# Patient Record
Sex: Male | Born: 1980
Health system: Southern US, Community
[De-identification: ages and names within clinical notes are randomized; demographics above are authoritative.]

## PROBLEM LIST (undated history)

## (undated) DIAGNOSIS — L709 Acne, unspecified: Secondary | ICD-10-CM

## (undated) DIAGNOSIS — R569 Unspecified convulsions: Secondary | ICD-10-CM

## (undated) DIAGNOSIS — F17200 Nicotine dependence, unspecified, uncomplicated: Secondary | ICD-10-CM

## (undated) DIAGNOSIS — F419 Anxiety disorder, unspecified: Secondary | ICD-10-CM

## (undated) DIAGNOSIS — E785 Hyperlipidemia, unspecified: Secondary | ICD-10-CM

## (undated) DIAGNOSIS — Z720 Tobacco use: Secondary | ICD-10-CM

## (undated) DIAGNOSIS — R7989 Other specified abnormal findings of blood chemistry: Secondary | ICD-10-CM

## (undated) DIAGNOSIS — K644 Residual hemorrhoidal skin tags: Secondary | ICD-10-CM

## (undated) DIAGNOSIS — IMO0002 Reserved for concepts with insufficient information to code with codable children: Secondary | ICD-10-CM

## (undated) DIAGNOSIS — T7840XA Allergy, unspecified, initial encounter: Secondary | ICD-10-CM

## (undated) DIAGNOSIS — T63441A Toxic effect of venom of bees, accidental (unintentional), initial encounter: Secondary | ICD-10-CM

## (undated) DIAGNOSIS — I10 Essential (primary) hypertension: Secondary | ICD-10-CM

## (undated) DIAGNOSIS — F1011 Alcohol abuse, in remission: Secondary | ICD-10-CM

## (undated) HISTORY — DX: Allergy, unspecified, initial encounter: T78.40XA

## (undated) HISTORY — DX: Tobacco use: Z72.0

## (undated) HISTORY — DX: Unspecified convulsions: R56.9

## (undated) HISTORY — DX: Anxiety disorder, unspecified: F41.9

## (undated) HISTORY — DX: Alcohol abuse, in remission: F10.11

## (undated) HISTORY — DX: Hyperlipidemia, unspecified: E78.5

## (undated) HISTORY — DX: Other specified abnormal findings of blood chemistry: R79.89

## (undated) HISTORY — DX: Acne, unspecified: L70.9

## (undated) HISTORY — DX: Toxic effect of venom of bees, accidental (unintentional), initial encounter: T63.441A

## (undated) HISTORY — DX: Nicotine dependence, unspecified, uncomplicated: F17.200

## (undated) HISTORY — DX: Reserved for concepts with insufficient information to code with codable children: IMO0002

## (undated) HISTORY — DX: Essential (primary) hypertension: I10

---
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
## 2007-10-23 DIAGNOSIS — R569 Unspecified convulsions: Secondary | ICD-10-CM

## 2007-10-23 HISTORY — PX: LACERATION REPAIR: SHX5168

## 2007-10-23 HISTORY — DX: Unspecified convulsions: R56.9

## 2008-01-08 ENCOUNTER — Emergency Department (HOSPITAL_COMMUNITY): Admission: EM | Admit: 2008-01-08 | Discharge: 2008-01-09 | Payer: Self-pay | Admitting: Emergency Medicine

## 2008-01-08 ENCOUNTER — Emergency Department (HOSPITAL_COMMUNITY): Admission: EM | Admit: 2008-01-08 | Discharge: 2008-01-08 | Payer: Self-pay | Admitting: Emergency Medicine

## 2011-03-09 NOTE — H&P (Signed)
[place]  Patient:    Wesley Cruz, Wesley Cruz                     MRN: [id number] Adm. Date:  [id number] Attending:  Kendell Bane                         History and Physical  PREOPERATIVE DIAGNOSIS:  Fracture, mid-shaft, right fifth metacarpal.  POSTOPERATIVE DIAGNOSIS:  Fracture, mid-shaft, right fifth metacarpal.  PROCEDURE:  Open reduction and internal fixation, right fifth metacarpal.  SURGEON:  Lowell Bouton, M.D.  ANESTHESIA:  General.  OPERATIVE FINDINGS:  The patient had a 35 degree apex dorsal angulation of the fracture with some callus formation around the fracture site.  It was not particularly comminuted.  Basically, the fracture was transverse.  DESCRIPTION OF PROCEDURE:  Under general anesthesia with a tourniquet on the right arm, the right hand was prepped and draped in usual fashion and after exsanguinating the limb, the tourniquet was inflated to 250 mmHg.  A longitudinal incision was made in line with the fifth metacarpal on the right hand dorsally.  Blunt dissection was carried through the subcutaneous tissues and bleeding points were coagulated.  Sharp dissection was carried down between the extensor tendons to the periosteum and this was incised sharply.  There was a very thick layer of periosteum that was elevated with a Therapist, nutritional around the fracture site. The fracture site was then identified and any callus formation was removed and saved in a basin.  The fracture site was cleaned of callus and blood using a curette and  then the fracture was reduced and stabilized with an Ikuta clamp.  A five-hole mini-fragment plate was then contoured for the dorsum of the fifth metacarpal and was placed dorsally.  A 0.045 K-wire was placed obliquely across the fracture to hold it temporarily and x-rays were obtained showing good alignment.  The plate was then applied dorsally, with two screws  proximally and two screws distally, with  some compression across the fracture site.  The clamps were removed and the K-wire was removed.  X-rays showed near-anatomic alignment.  The wound was irrigated copiously with saline and the periosteum was closed with 4-0 Mersilene.  The subcutaneous tissue was closed with 4-0 Mersilene and the skin with a 4-0 subcuticular Prolene.  Steri-Strips were applied, followed by sterile dressings and volar and dorsal splints.  The patient had the tourniquet released, with good circulation to the hand.  He tolerated the procedure well and went to the recovery room awake and stable and in good condition. DD:  12/29/99 TD:  12/29/99 Job: [id number]

## 2011-07-16 LAB — I-STAT 8, (EC8 V) (CONVERTED LAB)
Acid-base deficit: 2
Chloride: 107
Glucose, Bld: 118 — ABNORMAL HIGH
Potassium: 3.9
TCO2: 24
pCO2, Ven: 39.1 — ABNORMAL LOW
pH, Ven: 7.379 — ABNORMAL HIGH

## 2011-07-16 LAB — RAPID URINE DRUG SCREEN, HOSP PERFORMED
Amphetamines: NOT DETECTED
Benzodiazepines: NOT DETECTED
Cocaine: NOT DETECTED

## 2011-07-16 LAB — POCT I-STAT CREATININE
Creatinine, Ser: 1
Operator id: 161631

## 2011-11-23 ENCOUNTER — Ambulatory Visit: Payer: Self-pay | Admitting: Family Medicine

## 2011-11-23 ENCOUNTER — Ambulatory Visit: Payer: Self-pay

## 2011-11-23 DIAGNOSIS — M79672 Pain in left foot: Secondary | ICD-10-CM

## 2011-11-23 DIAGNOSIS — M79609 Pain in unspecified limb: Secondary | ICD-10-CM

## 2011-11-23 DIAGNOSIS — M25472 Effusion, left ankle: Secondary | ICD-10-CM

## 2011-11-23 DIAGNOSIS — M25579 Pain in unspecified ankle and joints of unspecified foot: Secondary | ICD-10-CM

## 2011-11-23 NOTE — Progress Notes (Signed)
  Subjective:    Patient ID: Wesley Cruz, male    DOB: [birth year], 31 y.o.   MRN: [id number]  Foot Pain This is a new problem. The current episode started in the past 7 days. The problem occurs constantly. The problem has been unchanged. Associated symptoms include joint swelling. Pertinent negatives include no abdominal pain, anorexia, arthralgias, chest pain, fatigue, fever, myalgias, nausea, rash, visual change or vomiting. The symptoms are aggravated by walking. He has tried rest, ice and NSAIDs for the symptoms. The treatment provided mild relief.      Review of Systems  Constitutional: Negative.  Negative for fever and fatigue.  HENT: Negative.   Eyes: Negative.   Respiratory: Negative.   Cardiovascular: Negative.  Negative for chest pain.  Gastrointestinal: Negative.  Negative for nausea, vomiting, abdominal pain and anorexia.  Genitourinary: Negative.   Musculoskeletal: Positive for joint swelling and gait problem. Negative for myalgias and arthralgias.  Skin: Negative for rash.  Neurological: Negative.    + hx sprain ankle but no fx, no OA, Osteoporoisis, no steroid use.     Objective:   Physical Exam  Constitutional: He is oriented to person, place, and time. He appears well-developed and well-nourished.  HENT:  Head: Normocephalic.  Eyes: EOM are normal. Pupils are equal, round, and reactive to light.  Neck: Normal range of motion. Neck supple.  Cardiovascular: Normal rate and regular rhythm.   Pulmonary/Chest: Effort normal and breath sounds normal.  Abdominal: Soft. Bowel sounds are normal.  Musculoskeletal: He exhibits edema and tenderness.  Neurological: He is alert and oriented to person, place, and time. He displays normal reflexes.  Skin: Skin is warm.  Psychiatric: He has a normal mood and affect.   Neurovascularly  intact        Assessment & Plan:  1. Left mid  foot pain at work- Questionable ligamentous injury vs metatarsalagia vs unlikely [employer] fx. Ankle and foot xray no fx, dislocation, RICE, camwalker, NSAID, Tylenol prn. Will refer to ortho as needed. F/u 1-2 week. 2. Elevated BP without dx of HTN- Asked pt to measure BP QOD x 2-3 weeks and see if he needs to be on meds. Strong family hx of HTN  [facility] reading (PRIMARY) by  Dr.Dania Marsan: no appreciable fx/dislocation on Left ankle or foot. Marland Kitchen

## 2013-09-21 ENCOUNTER — Ambulatory Visit (INDEPENDENT_AMBULATORY_CARE_PROVIDER_SITE_OTHER): Payer: BC Managed Care – PPO | Admitting: Medical

## 2013-09-21 ENCOUNTER — Encounter: Payer: Self-pay | Admitting: Medical

## 2013-09-21 VITALS — BP 150/110 | HR 80 | Temp 98.1°F | Resp 16 | Ht 74.5 in | Wt 234.0 lb

## 2013-09-21 DIAGNOSIS — F17200 Nicotine dependence, unspecified, uncomplicated: Secondary | ICD-10-CM

## 2013-09-21 DIAGNOSIS — R030 Elevated blood-pressure reading, without diagnosis of hypertension: Secondary | ICD-10-CM

## 2013-09-21 DIAGNOSIS — Z0000 Encounter for general adult medical examination without abnormal findings: Secondary | ICD-10-CM

## 2013-09-21 DIAGNOSIS — G8929 Other chronic pain: Secondary | ICD-10-CM

## 2013-09-21 DIAGNOSIS — M25569 Pain in unspecified knee: Secondary | ICD-10-CM

## 2013-09-21 DIAGNOSIS — F1010 Alcohol abuse, uncomplicated: Secondary | ICD-10-CM

## 2013-09-21 DIAGNOSIS — R21 Rash and other nonspecific skin eruption: Secondary | ICD-10-CM

## 2013-09-21 LAB — COMPREHENSIVE METABOLIC PANEL
ALT: 64 U/L — ABNORMAL HIGH (ref 0–53)
Albumin: 5.1 g/dL (ref 3.5–5.2)
CO2: 27 mEq/L (ref 19–32)
Calcium: 10.4 mg/dL (ref 8.4–10.5)
Chloride: 100 mEq/L (ref 96–112)
Glucose, Bld: 98 mg/dL (ref 70–99)
Sodium: 138 mEq/L (ref 135–145)
Total Protein: 7.7 g/dL (ref 6.0–8.3)

## 2013-09-21 LAB — CBC WITH DIFFERENTIAL/PLATELET
Basophils Absolute: 0 10*3/uL (ref 0.0–0.1)
Eosinophils Relative: 3 % (ref 0–5)
Lymphocytes Relative: 24 % (ref 12–46)
Lymphs Abs: 1.4 10*3/uL (ref 0.7–4.0)
Neutro Abs: 3.9 10*3/uL (ref 1.7–7.7)
Neutrophils Relative %: 63 % (ref 43–77)
Platelets: 269 10*3/uL (ref 150–400)
RBC: 5.08 MIL/uL (ref 4.22–5.81)
RDW: 12.9 % (ref 11.5–15.5)
WBC: 6 10*3/uL (ref 4.0–10.5)

## 2013-09-21 LAB — LIPID PANEL
Cholesterol: 280 mg/dL — ABNORMAL HIGH (ref 0–200)
HDL: 62 mg/dL (ref >39)
Triglycerides: 218 mg/dL — ABNORMAL HIGH (ref <150)

## 2013-09-21 LAB — TSH: TSH: 1.764 u[IU]/mL (ref 0.350–4.500)

## 2013-09-21 MED ORDER — EPINEPHRINE 0.3 MG/0.3ML IJ SOAJ: 0.3000 mg | Freq: Once | INTRAMUSCULAR | Status: DC

## 2013-09-21 NOTE — Progress Notes (Signed)
Subjective:   HPI  Wesley Cruz is a 32 y.o. male who presents for a complete physical.  New patient today.  Preventative care: Last ophthalmology visit:n/a Last dental visit:yes- [facility] Last colonoscopy:n/a Last prostate exam: n/a Last EKG:n/a Last labs:n/a  Prior vaccinations: TD or Tdap:11/2010 Influenza:no flu vaccine Pneumococcal:n/a Shingles/Zostavax:n/a  Advanced directive:n/a Health care power of attorney:n/a Living will:n/a  Concerns: No hx/o hypertension.  He does have strong family hx/o high BP though.  Rash - long hx/o scaling red lesions.  No prior official diagnosis of psoriasis.  Uses OTC creams on occasion  Knee pain - left x 4 years.  No hx/o injury or trauma.  Gives him pain with squatting, lateral motion, but can turn without pain with his golf swing.  Works on [employer], lifts heavy objects at work.   Reviewed their medical, surgical, family, social, medication, and allergy history and updated chart as appropriate.  Past Medical History  Diagnosis Date  . Bee sting-induced anaphylaxis 2013  . Elevated blood pressure   . Laceration 2009    right hand  . Seizure 2009    questionable seizure, syncope, one episode, none since - seen at [facility] ED    Past Surgical History  Procedure Laterality Date  . Laceration repair  2009    right dorsal hand    History   Social History  . Marital Status: Married    Spouse Name: N/A    Number of Children: N/A  . Years of Education: N/A   Occupational History  . Not on file.   Social History Main Topics  . Smoking status: Current Every Day Smoker -- 0.50 packs/day for 12 years    Types: Cigarettes  . Smokeless tobacco: Not on file  . Alcohol Use: 18.0 oz/week    30 Cans of beer per week  . Drug Use: No  . Sexual Activity: Not on file   Other Topics Concern  . Not on file   Social History Narrative   Married, has dogs, no children, exercise - walking, heavy lifting on the job,  Christian    Family History  Problem Relation Age of Onset  . Hypertension Father   . Hypertension Paternal Uncle   . Miscarriages / Stillbirths Paternal Uncle     Current outpatient prescriptions:EPINEPHrine (EPIPEN) 0.3 mg/0.3 mL SOAJ injection, Inject 0.3 mLs (0.3 mg total) into the muscle once., Disp: 1 Device, Rfl: 0  Allergies  Allergen Reactions  . Hornet Venom Anaphylaxis    Japanese hornet     Review of Systems Constitutional: -fever, -chills, -sweats, -unexpected weight change, -decreased appetite, -fatigue Allergy: -sneezing, -itching, -congestion Dermatology: -changing moles, --rash, -lumps, +skin issues ENT: -runny nose, -ear pain, -sore throat, -hoarseness, -sinus pain, -teeth pain, - ringing in ears, -hearing loss, -nosebleeds Cardiology: -chest pain, -palpitations, -swelling, -difficulty breathing when lying flat, -waking up short of breath Respiratory: -cough, -shortness of breath, -difficulty breathing with exercise or exertion, -wheezing, -coughing up blood Gastroenterology: -abdominal pain, -nausea, -vomiting, -diarrhea, -constipation, -blood in stool, -changes in bowel movement, -difficulty swallowing or eating Hematology: -bleeding, -bruising  Musculoskeletal: +joint aches, -muscle aches, -joint swelling, -back pain, -neck pain, -cramping, -changes in gait Ophthalmology: denies vision changes, eye redness, itching, discharge Urology: -burning with urination, -difficulty urinating, -blood in urine, -urinary frequency, -urgency, -incontinence Neurology: -headache, -weakness, -tingling, -numbness, -memory loss, -falls, -dizziness Psychology: -depressed mood, -agitation, -sleep problems, +stress     Objective:   Physical Exam  BP 150/110  Pulse 80  Temp(Src)  98.1 F (36.7 C) (Oral)  Resp 16  Ht 6' 2.5" (1.892 m)  Wt 234 lb (106.142 kg)  BMI 29.65 kg/m2  General appearance: alert, no distress, WD/WN, white male Skin: right dorsal medial hand with  surgical scar, few scattered benign appearing lesions HEENT: normocephalic, conjunctiva/corneas normal, sclerae anicteric, PERRLA, EOMi, nares patent, no discharge or erythema, pharynx normal Oral cavity: MMM, tongue normal, teeth in good repair Neck: supple, no lymphadenopathy, no thyromegaly, no masses, normal ROM, no bruits Chest: non tender, normal shape and expansion Heart: RRR, normal S1, S2, no murmurs Lungs: CTA bilaterally, no wheezes, rhonchi, or rales Abdomen: +bs, soft, non tender, non distended, no masses, no hepatomegaly, no splenomegaly, no bruits Back: non tender, normal ROM, no scoliosis Musculoskeletal: +pain with left knee Mcmurray, laxity of MCL and pain with manipulation, otherwise upper extremities non tender, no obvious deformity, normal ROM throughout, lower extremities non tender, no obvious deformity, normal ROM throughout Extremities: no edema, no cyanosis, no clubbing Pulses: 2+ symmetric, upper and lower extremities, normal cap refill Neurological: alert, oriented x 3, CN2-12 intact, strength normal upper extremities and lower extremities, sensation normal throughout, DTRs 2+ throughout, no cerebellar signs, gait normal Psychiatric: normal affect, behavior normal, pleasant  GU: normal male external genitalia, circumcised, nontender, no masses, no hernia, no lymphadenopathy Rectal: deferred   Adult ECG Report  Indication: elevated HTN  Rate: 79bpm  Rhythm: NSR with sinus arrhythmia  QRS Axis: 73 degrees  PR Interval:  QRS Duration: 92ms  QTc:  Conduction Disturbances: none  Other Abnormalities: none  Patient's cardiac risk factors are: family history of premature cardiovascular disease, male gender and smoking/ tobacco exposure.  EKG comparison: none  Narrative Interpretation: unremarkable EKG     Assessment and Plan :      Encounter Diagnoses  Name Primary?  . Routine general medical examination at a health care facility Yes  . Elevated  blood pressure reading without diagnosis of hypertension   . Tobacco use disorder   . Excessive drinking alcohol   . Rash and nonspecific skin eruption   . Knee pain, chronic, left     Physical exam - discussed healthy lifestyle, diet, exercise, preventative care, vaccinations, and addressed their concerns.   Elevated BP - he will check BPs with father's cuff, record readings, and f/u in 1-2wk Tobacco use - recommended he consider cessation. Discussed risks of tobacco use Alcohol use - advised he limit to 2 drinks daily, preferably not drinking daily Rash - likely psoriasis.  F/u pending labs Knee pain - Dr. Susann Givens, supervising physician also examined patient.  Will set up for knee MRI. Follow-up pending studies

## 2013-10-02 ENCOUNTER — Ambulatory Visit
Admission: RE | Admit: 2013-10-02 | Discharge: 2013-10-02 | Disposition: A | Discharge status: Home / Self Care | Payer: Self-pay | Source: Ambulatory Visit | Attending: Medical | Admitting: Medical

## 2013-10-02 DIAGNOSIS — G8929 Other chronic pain: Secondary | ICD-10-CM

## 2013-10-09 ENCOUNTER — Encounter: Payer: Self-pay | Admitting: Medical

## 2013-10-09 ENCOUNTER — Ambulatory Visit (INDEPENDENT_AMBULATORY_CARE_PROVIDER_SITE_OTHER): Payer: BC Managed Care – PPO | Admitting: Medical

## 2013-10-09 VITALS — BP 150/78 | HR 90 | Temp 98.3°F | Wt 237.0 lb

## 2013-10-09 DIAGNOSIS — F17200 Nicotine dependence, unspecified, uncomplicated: Secondary | ICD-10-CM

## 2013-10-09 DIAGNOSIS — E782 Mixed hyperlipidemia: Secondary | ICD-10-CM

## 2013-10-09 DIAGNOSIS — I10 Essential (primary) hypertension: Secondary | ICD-10-CM

## 2013-10-09 DIAGNOSIS — R7989 Other specified abnormal findings of blood chemistry: Secondary | ICD-10-CM

## 2013-10-09 DIAGNOSIS — M25569 Pain in unspecified knee: Secondary | ICD-10-CM

## 2013-10-09 DIAGNOSIS — G8929 Other chronic pain: Secondary | ICD-10-CM

## 2013-10-09 NOTE — Progress Notes (Signed)
  Subjective:  Wesley Cruz is a 32 y.o. male who presents for f/u on labs, physical, MRI.    Elevated BP - no prior hx/o hypertension, but it is prevalent in his family.  Denies chest pain, vision changes, no paresthesias, no edema.  He has been checking BPs since last visit.  Readings include:  130-170 SBP, 74-96 DBP, most numbers being elevated.     Tobacco use - trying to make some efforts at tobacco cessation.    Left knee pain, chronic - left x 4 years. No hx/o injury or trauma. Gives him pain with squatting, lateral motion, but can turn without pain with his golf swing. Works on [employer], lifts heavy objects at work  LFTs - no prior hx/o hepatitis, elevated LFTs.  He has tried to cut back on alcohol since last video.  Mixed dyslipidemia - no prior similar.  Diet currently until last viist is no discretion, lots of red meat and fried foods, large portions.  No other c/o.  The following portions of the patient's history were reviewed and updated as appropriate: allergies, current medications, past family history, past medical history, past social history, past surgical history and problem list.  ROS Otherwise as in subjective above  Objective: Physical Exam  BP 150/78  Pulse 90  Temp(Src) 98.3 F (36.8 C) (Oral)  Wt 237 lb (107.502 kg)  SpO2 99%   General appearance: alert, no distress, WD/WN    Assessment: Encounter Diagnoses  Name Primary?  . Essential hypertension, benign Yes  . Mixed dyslipidemia   . Elevated LFTs   . Tobacco use disorder   . Knee pain, chronic, left      Plan: We discussed his elevated BPs, recent abnormal labs (elevated LFTs, elevated LDL and total chol, elevated Triglycerides).  Discussed risks of uncontrolled BP.  Discussed treatment options.  Discussed the need to significantly improve diet, exercise regularly, cut out tobacco and cut down on alcohol.  We will refer to  Pharmquest hypertension study.  Pending study, he will at the least use lifestyle changes to improve on hypertension.  He will need to recheck no later than 84mo after trial of lifestyle changes.   Knee pain, left, chronic - discussed his chronic symptoms, abnormal MRI.   Refer to orthopedist.  Follow up: 84mo

## 2013-11-04 ENCOUNTER — Telehealth: Payer: Self-pay | Admitting: Medical

## 2013-11-04 ENCOUNTER — Other Ambulatory Visit: Payer: Self-pay | Admitting: Medical

## 2013-11-04 MED ORDER — CALCIPOTRIENE-BETAMETH DIPROP 0.005-0.064 % EX OINT: TOPICAL_OINTMENT | Freq: Every day | CUTANEOUS | Status: DC

## 2013-11-04 NOTE — Telephone Encounter (Signed)
I sent the medication. We actually briefly discussed this at his physical, and my understanding was he had never had a official diagnosis, and I recall not being aware that he was on medication.  Regardless, let us see how the medication does, and if he has never had a biopsy, thus consider biopsy next visit.

## 2013-11-04 NOTE — Telephone Encounter (Signed)
Pt states that he discussed with you at a office visit the medication that he is currently on for psoriasis. He states that you discussed with him filling this medication. Medication is Taclonex (pt did spell it for me). Please send to [facility] on [address]. Pt can be reached at [phone].

## 2013-11-04 NOTE — Telephone Encounter (Signed)
Forward to chandra  °

## 2013-11-04 NOTE — Telephone Encounter (Signed)
Pt called and stated that he gets a medication for his psoriasis that he states he discussed with you at a office visit. Pt states that he discussed with you and mentioned that he would like you to fill it.

## 2013-11-06 NOTE — Telephone Encounter (Signed)
done

## 2013-12-01 ENCOUNTER — Telehealth: Payer: Self-pay | Admitting: Family Medicine

## 2013-12-01 NOTE — Telephone Encounter (Signed)
Message copied by Armanda Magic on Tue Dec 01, 2013  3:02 PM ------      Message from: Carlena Hurl      Created: Mon Nov 30, 2013  9:12 AM       I received notification that the Pharmquest study on hypertension closed prior to him begin referred.  See if he is making diet and exercise changes to help improve cholesterol and BP.              I would like to see him back in the near future regarding treatment for high blood pressure.  See if he is checking his BP numbers?  If he is working on lifestyle changes (diet/exercise) then f/u in 52mo, if not, f/u at this time.             Also , I never got notes from where we referred to [place].  Pls get them. ------

## 2013-12-01 NOTE — Telephone Encounter (Signed)
Patient is aware of Dorothea Ogle PA-C message and he said he some what working on diet and exercise he could do better. He said that he would start checking his BP and he would follow up here in 3 months. I also called over to [employer]. And they will fax over the records. CLS

## 2014-03-15 ENCOUNTER — Emergency Department (HOSPITAL_COMMUNITY)
Admission: EM | Admit: 2014-03-15 | Discharge: 2014-03-16 | Disposition: A | Discharge status: Home / Self Care | Payer: BC Managed Care – PPO | Attending: Emergency Medicine | Admitting: Emergency Medicine

## 2014-03-15 ENCOUNTER — Encounter (HOSPITAL_COMMUNITY): Payer: Self-pay | Admitting: Emergency Medicine

## 2014-03-15 DIAGNOSIS — Z8669 Personal history of other diseases of the nervous system and sense organs: Secondary | ICD-10-CM | POA: Insufficient documentation

## 2014-03-15 DIAGNOSIS — Z79899 Other long term (current) drug therapy: Secondary | ICD-10-CM | POA: Insufficient documentation

## 2014-03-15 DIAGNOSIS — R0602 Shortness of breath: Secondary | ICD-10-CM | POA: Insufficient documentation

## 2014-03-15 DIAGNOSIS — R61 Generalized hyperhidrosis: Secondary | ICD-10-CM | POA: Insufficient documentation

## 2014-03-15 DIAGNOSIS — Z87828 Personal history of other (healed) physical injury and trauma: Secondary | ICD-10-CM | POA: Insufficient documentation

## 2014-03-15 DIAGNOSIS — F411 Generalized anxiety disorder: Secondary | ICD-10-CM | POA: Insufficient documentation

## 2014-03-15 DIAGNOSIS — T7840XA Allergy, unspecified, initial encounter: Secondary | ICD-10-CM

## 2014-03-15 DIAGNOSIS — L509 Urticaria, unspecified: Secondary | ICD-10-CM | POA: Insufficient documentation

## 2014-03-15 DIAGNOSIS — F17200 Nicotine dependence, unspecified, uncomplicated: Secondary | ICD-10-CM | POA: Insufficient documentation

## 2014-03-15 DIAGNOSIS — R062 Wheezing: Secondary | ICD-10-CM | POA: Insufficient documentation

## 2014-03-15 LAB — I-STAT CHEM 8, ED
BUN: 8 mg/dL (ref 6–23)
Calcium, Ion: 1.06 mmol/L — ABNORMAL LOW (ref 1.12–1.23)
Chloride: 100 meq/L (ref 96–112)
Creatinine, Ser: 0.9 mg/dL (ref 0.50–1.35)
Glucose, Bld: 152 mg/dL — ABNORMAL HIGH (ref 70–99)
HCT: 47 % (ref 39.0–52.0)
Hemoglobin: 16 g/dL (ref 13.0–17.0)
Potassium: 3.5 meq/L — ABNORMAL LOW (ref 3.7–5.3)
Sodium: 141 meq/L (ref 137–147)
TCO2: 23 mmol/L (ref 0–100)

## 2014-03-15 MED ORDER — EPINEPHRINE 0.3 MG/0.3ML IJ SOAJ
INTRAMUSCULAR | Status: AC
  Administered 2014-03-15: 0.3 mg
  Filled 2014-03-15: qty 0.3

## 2014-03-15 MED ORDER — FAMOTIDINE IN NACL 20-0.9 MG/50ML-% IV SOLN
20.0000 mg | Freq: Once | INTRAVENOUS | Status: AC
  Administered 2014-03-15: 20 mg via INTRAVENOUS
  Filled 2014-03-15: qty 50

## 2014-03-15 MED ORDER — DIPHENHYDRAMINE HCL 50 MG/ML IJ SOLN: 50.0000 mg | Freq: Once | INTRAMUSCULAR | Status: DC

## 2014-03-15 MED ORDER — METHYLPREDNISOLONE SODIUM SUCC 125 MG IJ SOLR
125.0000 mg | Freq: Once | INTRAMUSCULAR | Status: AC
  Administered 2014-03-15: 125 mg via INTRAVENOUS
  Filled 2014-03-15: qty 2

## 2014-03-15 MED ORDER — SODIUM CHLORIDE 0.9 % IV BOLUS (SEPSIS)
1000.0000 mL | Freq: Once | INTRAVENOUS | Status: AC
  Administered 2014-03-15: 1000 mL via INTRAVENOUS

## 2014-03-15 MED ORDER — DIPHENHYDRAMINE HCL 50 MG/ML IJ SOLN
12.5000 mg | Freq: Once | INTRAMUSCULAR | Status: AC
  Administered 2014-03-15: 12.5 mg via INTRAVENOUS
  Filled 2014-03-15: qty 1

## 2014-03-15 NOTE — ED Notes (Signed)
Patients states 3 - 25mg  Benadryl taken aprox. 22:45; Also give Epi Pen ant same time but Wife states Epi expired 04/14/13

## 2014-03-15 NOTE — ED Notes (Signed)
Woke up approx 45 minutes ago with hives all over and SOB. Came back from the [employer], but doesn't know what he came into contact with. Reports last time this happened he was stung by a hornet.

## 2014-03-15 NOTE — ED Provider Notes (Signed)
33 year old male, history of severe allergic reaction approximately one year ago to an insect staying requiring EpiPen prescription, presents today after acute onset of rash itchiness and feeling bad, appears similar to his prior allergic reaction though he does not recall any specific inciting event including no bee stings. He took an EpiPen prior to arrival but states that he was out of date, he had 75 mg of Benadryl 20 minutes prior to arrival, he is felt no better, denies any throat swelling and denies shortness of breath. On my exam he is covered in urticaria, this is severe, predominantly the upper extremities, lower extremities and the axilla as well as the sides and flank. There is no lesions in his mouth he does have spotty urticaria on his face. No wheezing, clear oropharynx, no swelling of the tongue or the lips, blood pressure is normal, heart rate is normal, oxygenation is normal. He will require a second EpiPen dose, Solu-Medrol, Pepcid, observation, at this time he does not appear anaphylactic though he does have severe allergy to some exposure.  Reevaluated at 2:30 AM, rash has nearly completely resolved, still has mild itching no shortness of breath, no swelling of the mouth or oropharynx. He has improved with EpiPen, steroids and antihistamines, discussed with the patient indications for return and need for followup for allergy testing with family doctor. He and his significant other are in agreement with the plan. There has been no hypotension or hypoxia throughout the patient's stay.  Medical screening examination/treatment/procedure(s) were conducted as a shared visit with non-physician practitioner(s) and myself.  I personally evaluated the patient during the encounter.  Clinical Impression: Severe allergic reaction   Meds given in ED:  Medications  EPINEPHrine (EPI-PEN) 0.3 mg/0.3 mL injection (0.3 mg  Given 03/15/14 2330)  sodium chloride 0.9 % bolus 1,000 mL (0 mLs Intravenous  Stopped 03/16/14 0110)  methylPREDNISolone sodium succinate (SOLU-MEDROL) 125 mg/2 mL injection 125 mg (125 mg Intravenous Given 03/15/14 2343)  famotidine (PEPCID) IVPB 20 mg (0 mg Intravenous Stopped 03/16/14 0030)  diphenhydrAMINE (BENADRYL) injection 12.5 mg (12.5 mg Intravenous Given 03/15/14 2343)  LORazepam (ATIVAN) injection 0.5 mg (0.5 mg Intravenous Given 03/16/14 0029)  hydrOXYzine (ATARAX/VISTARIL) tablet 25 mg (25 mg Oral Given 03/16/14 0109)    New Prescriptions   DIPHENHYDRAMINE (BENADRYL) 25 MG TABLET    Take 1 tablet (25 mg total) by mouth every 6 (six) hours as needed for itching (Rash).   EPINEPHRINE (EPIPEN 2-PAK) 0.3 MG/0.3 ML IJ SOAJ INJECTION    Inject 0.3 mLs (0.3 mg total) into the muscle once as needed (for severe allergic reaction). CAll 911 immediately if you have to use this medicine   FAMOTIDINE (PEPCID) 20 MG TABLET    Take 1 tablet (20 mg total) by mouth 2 (two) times daily.   PREDNISONE (DELTASONE) 20 MG TABLET    Take 2 tablets (40 mg total) by mouth daily.       Johnna Acosta, MD 03/16/14 [phone]

## 2014-03-15 NOTE — ED Provider Notes (Signed)
CSN: [id number]     Arrival date & time 03/15/14  2308 History   First MD Initiated Contact with Patient 03/15/14 2323     Chief Complaint  Patient presents with  . Allergic Reaction     (Consider location/radiation/quality/duration/timing/severity/associated sxs/prior Treatment) HPI  Pt to the ER emergently for allergic reaction. [place] does not recall being bit by anything in aprticular but notices an area under his armpit where he feels like he came into contact with whateveri s causing the reaction. His wife and him looked throughout the bed and did not find anything. This happened 45 minutes prior to arrival, she gave him and expired epi pen and 3 x 25mg  Benadryl PO. The rash spread throughout his whole body and he developed SOB. He denies feeling any throat tightening or tongue/lip swelling. He reports the rash and being itchy and extremely unnerving. He reports being allergic to hornet venom but otherwise is unaware of what he is allergic too. He is able to speak in complete sentences.  Filed Vitals:   03/16/14 0010  BP: 122/82  Pulse: 92  Temp:   Resp: 16      Past Medical History  Diagnosis Date  . Bee sting-induced anaphylaxis 2013  . Elevated blood pressure   . Laceration 2009    right hand  . Seizure 2009    questionable seizure, syncope, one episode, none since - seen at [facility] ED   Past Surgical History  Procedure Laterality Date  . Laceration repair  2009    right dorsal hand   Family History  Problem Relation Age of Onset  . Hypertension Father   . Hypertension Paternal Uncle   . Stroke Paternal Uncle    History  Substance Use Topics  . Smoking status: Current Every Day Smoker -- 0.50 packs/day for 12 years    Types: Cigarettes  . Smokeless tobacco: Not on file  . Alcohol Use: 18.0 oz/week    30 Cans of beer per week    Review of Systems   Review of Systems  Gen: no weight loss, fevers, chills, +  diaphoresis Eyes: no discharge or drainage, no occular pain or visual changes  Nose: no epistaxis or rhinorrhea  Mouth: no dental pain, no sore throat  Neck: no neck pain  Lungs:No coughing or hemoptysis, + Wheezing and SOB CV: no chest pain, palpitations, dependent edema or orthopnea  Abd: no abdominal pain, nausea, vomiting, diarrhea GU: no dysuria or gross hematuria  MSK:  No muscle weakness or pain Neuro: no headache, no focal neurologic deficits  Skin: + severe urticaria and itching Psyche: no complaints    Allergies  Hornet venom  Home Medications   Prior to Admission medications   Medication Sig Start Date End Date Taking? Authorizing Provider  calcipotriene-betamethasone (TACLONEX) ointment Apply topically daily. 11/04/13   Camelia Eng Tysinger, PA-C  EPINEPHrine (EPIPEN) 0.3 mg/0.3 mL SOAJ injection Inject 0.3 mLs (0.3 mg total) into the muscle once. 09/21/13   Camelia Eng Tysinger, PA-C  ibuprofen (ADVIL) 200 MG tablet Take 800 mg by mouth every 6 (six) hours as needed.    Historical Provider, MD   BP 122/82  Pulse 92  Temp(Src) 98.1 F (36.7 C) (Oral)  Resp 16  SpO2 99% Physical Exam  Nursing note and vitals reviewed. Constitutional: He appears well-developed and well-nourished. No distress.  HENT:  Head: Normocephalic and atraumatic.  No intraoral swelling appreciated to tongue, lips or back of throat.  Eyes: Pupils are equal, round, and reactive to light.  Neck: Normal range of motion. Neck supple.  Cardiovascular: Normal rate and regular rhythm.   Pulmonary/Chest: Effort normal. He has no decreased breath sounds. He has no wheezes. He has no rhonchi.  Abdominal: Soft.  Neurological: He is alert.  Skin: Skin is warm and dry. Rash noted. Rash is urticarial (severe diffuse acute Urticaria).  Psychiatric: His mood appears anxious.      ED Course  Procedures (including critical care time) Labs Review Labs Reviewed  I-STAT CHEM 8, ED - Abnormal; Notable for the  following:    Potassium 3.5 (*)    Glucose, Bld 152 (*)    Calcium, Ion 1.06 (*)    All other components within normal limits    Imaging Review No results found.   EKG Interpretation   Date/Time:  Monday Mar 15 2014 23:12:45 EDT Ventricular Rate:  59 PR Interval:  124 QRS Duration: 98 QT Interval:  430 QTC Calculation: 425 R Axis:   88 Text Interpretation:  Sinus bradycardia Otherwise normal ECG since last  tracing no significant change Confirmed by MILLER  MD, BRIAN ([id number]) on  03/15/2014 11:31:47 PM      MDM   Final diagnoses:  None    Dr. Sabra Heck initially saw patient with me. Pt having severe reaction, does not appear to be anaphylactic at this time. IM epinephrine given.  11:40pm Already has taken 3 x 25mg  PO Benadryl, 12.5 mg IV benadryl given with, 125 mg IV Solumedrol, 20mg  IV Pepcid, 1 L IV fluids. Pt on cardiac monitor and will be closely monitored.  12:50am At end of shift, patient will be followed by Dr. Sabra Heck. Pt is being monitored to ensure resolution of symptoms. Symptoms have already resolved significantly. His urticaria is resolving and he says he feels much better. Reports still having itching, will try small dose of Ativan.    Linus Mako, PA-C 03/16/14 [phone]

## 2014-03-16 ENCOUNTER — Telehealth: Payer: Self-pay | Admitting: Internal Medicine

## 2014-03-16 MED ORDER — DIPHENHYDRAMINE HCL 25 MG PO TABS: 25.0000 mg | ORAL_TABLET | Freq: Four times a day (QID) | ORAL | Status: DC | PRN

## 2014-03-16 MED ORDER — HYDROXYZINE HCL 25 MG PO TABS
25.0000 mg | ORAL_TABLET | Freq: Once | ORAL | Status: AC
  Administered 2014-03-16: 25 mg via ORAL
  Filled 2014-03-16: qty 1

## 2014-03-16 MED ORDER — PREDNISONE 20 MG PO TABS: 40.0000 mg | ORAL_TABLET | Freq: Every day | ORAL | Status: DC

## 2014-03-16 MED ORDER — LORAZEPAM 2 MG/ML IJ SOLN
0.5000 mg | Freq: Once | INTRAMUSCULAR | Status: AC
  Administered 2014-03-16: 0.5 mg via INTRAVENOUS
  Filled 2014-03-16: qty 1

## 2014-03-16 MED ORDER — EPINEPHRINE 0.3 MG/0.3ML IJ SOAJ: 0.3000 mg | Freq: Once | INTRAMUSCULAR | Status: DC | PRN

## 2014-03-16 MED ORDER — FAMOTIDINE 20 MG PO TABS: 20.0000 mg | ORAL_TABLET | Freq: Two times a day (BID) | ORAL | Status: DC

## 2014-03-16 NOTE — ED Notes (Signed)
Pt. States "I feel better. I don't have the chest pain or SOB anymore. I'm just itchy".

## 2014-03-16 NOTE — ED Provider Notes (Signed)
Medical screening examination/treatment/procedure(s) were conducted as a shared visit with non-physician practitioner(s) and myself.  I personally evaluated the patient during the encounter  Please see my separate respective documentation pertaining to this patient encounter   Johnna Acosta, MD 03/16/14 [phone]

## 2014-03-16 NOTE — Telephone Encounter (Signed)
Pt states that he was seen in the er for allergic reaction. Pt is wanting to go to an allergist

## 2014-03-16 NOTE — ED Notes (Signed)
Pt. Presents with urticaria throughout entire body, slight facial swelling, mild wheezing, and 4/10 chest pain.

## 2014-03-16 NOTE — Discharge Instructions (Signed)
You have been diagnosed with an allergic reaction. Usually allergic reactions like this are caused by exposures to something that you either ate or touched or smelled.  It may be related to a number of different exposures including a new perfume, topical creams, soaps, detergents, linens, clothing, medications. Occasionally we do not find an answer for why there is an allergic reaction. These are treated the same way including Benadryl as needed for itching and rash. (This can be used up to 50 mg every 6 hours as needed).  Pepcid 20 mg every night and prednisone once a day for 5 days. Please do not take the Benadryl and drive or take care of children or other imported duties as the Benadryl can make you sleepy.   ° °If you should develop severe or worsening symptoms including difficulty breathing, difficulty swallowing, wheezing or increased coughing or a rash that developed on the inside of your mouth or a worsening rash on your skin, return to the hospital immediately for a recheck. Please call your Dr. in the morning for a recheck in 2 days if you are still having symptoms. If you do not have a Dr. see the list below.  If we have identified the source of your allergic reaction, please avoid this at all costs. This means stopping the medication if it is a new medication or a voiding topical exposures such as creams lotions body soaps or deodorants if this is the source. ° °Allergic Reaction, Mild to Moderate °Allergies may happen from anything your body is sensitive to. This may be food, medications, pollens, chemicals, and nearly anything around you in everyday life that produces allergens. An allergen is anything that causes an allergy producing substance. Allergens cause your body to release allergic antibodies. Through a chain of events, they cause a release of histamine into the blood stream. Histamines are meant to protect you, but they also cause your discomfort. This is why antihistamines are often used  for allergies. Heredity is often a factor in causing allergic reactions. This means you may have some of the same allergies as your parents. °Allergies happen in all age groups. You may have some idea of what caused your reaction. There are many allergens around us. It may be difficult to know what caused your reaction. If this is a first time event, it may never happen again. Allergies cannot be cured but can be controlled with medications. °SYMPTOMS  °You may get some or all of the following problems from allergies. °· Swelling and itching in and around the mouth.  °· Tearing, itchy eyes.  °· Nasal congestion and runny nose.  °· Sneezing and coughing.  °· An itchy red rash or hives.  °· Vomiting or diarrhea.  °· Difficulty breathing.  °Seasonal allergies occur in all age groups. They are seasonal because they usually occur during the same season every year. They may be a reaction to molds, grass pollens, or tree pollens. Other causes of allergies are house dust mite allergens, pet dander and mold spores. These are just a common few of the thousands of allergens around us. All of the symptoms listed above happen when you come in contact with pollens and other allergens. Seasonal allergies are usually not life threatening. They are generally more of a nuisance that can often be handled using medications. °Hay fever is a combination of all or some of the above listed allergy problems. It may often be treated with simple over-the-counter medications such as diphenhydramine. Take medication as   directed. Check with your caregiver or package insert for child dosages. °TREATMENT AND HOME CARE INSTRUCTIONS °If hives or rash are present: °· Take medications as directed.  °· You may use an over-the-counter antihistamine (diphenhydramine) for hives and itching as needed. Do not drive or drink alcohol until medications used to treat the reaction have worn off. Antihistamines tend to make people sleepy.  °· Apply cold cloths  (compresses) to the skin or take baths in cool water. This will help itching. Avoid hot baths or showers. Heat will make a rash and itching worse.  °· If your allergies persist and become more severe, and over the counter medications are not effective, there are many new medications your caretaker can prescribe. Immunotherapy or desensitizing injections can be used if all else fails. Follow up with your caregiver if problems continue.  °SEEK MEDICAL CARE IF:  °· Your allergies are becoming progressively more troublesome.  °· You suspect a food allergy. Symptoms generally happen within 30 minutes of eating a food.  °· Your symptoms have not gone away within 2 days or are getting worse.  °· You develop new symptoms.  °· You want to retest yourself or your child with a food or drink you think causes an allergic reaction. Never test yourself or your child of a suspected allergy without being under the watchful eye of your caregivers. A second exposure to an allergen may be life-threatening.  °SEEK IMMEDIATE MEDICAL CARE IF: °· You develop difficulty breathing or wheezing, or have a tight feeling in your chest or throat.  °· You develop a swollen mouth, hives, swelling, or itching all over your body.  °A severe reaction with any of the above problems should be considered life-threatening. If you suddenly develop difficulty breathing call for local emergency medical help. THIS IS AN EMERGENCY. °MAKE SURE YOU:  °· Understand these instructions.  °· Will watch your condition.  °· Will get help right away if you are not doing well or get worse.  °Document Released: 08/05/2007 Document Revised: 09/27/2011 Document Reviewed: 08/05/2007 °ExitCare® Patient Information ©2012 [employer]. ° °RESOURCE GUIDE ° °Chronic Pain Problems: °Contact [facility]  [phone] °Patients need to be referred by their primary care doctor. ° °Insufficient Money for Medicine: °Contact United Way:  call "211" or [facility] 271-5999. ° °No Primary Care Doctor: °- Call [facility]  832-8000 - can help you locate a primary care doctor that  accepts your insurance, provides certain services, etc. °- Physician Referral Service- 1-800-533-3463 ° °Agencies that provide inexpensive medical care: °- [facility]  832-8035 °- [facility]  832-7272 °- [facility]  [phone] °- [facility]  832-4777 °- [facility]  [phone] °- [facility]  [phone] ° °Medicaid-accepting [place] Providers: °- [facility]- [address] ° [phone], Mon-Fri 9am-7pm, Sat 9am-1pm °- [facility]- [address], [address] ° [phone] °- [facility]- [address], [address] ° [phone] °- [facility]- [address] [place] [address] ° [phone] °- [facility]- [address], [address], [phone] ° Only accepts [place] Access Medicaid patients after they have their name  applied to their card ° °Self Pay (no insurance) in [place]: °- Sickle Cell Patients: Dr Eric Dean, [facility] ° [address], [phone] °- [facility]- [address] °   [phone] °      -     [facility]- [address] °      -     [facility]- see information above (Speak to Pam H if you do not have insurance) °      -  [facility]- [address], [phone] °      -  [facility] [place]- [address],  [phone] °      -  [facility]- [address] [place] [address], [phone] °      -  Dr Osei-Bonsu-  [address], [place], [phone] °      -  [facility]- [address], [phone] °      -  [facility]- [address] [place] [address], [phone], also [address], [phone] °      -    [facility]- [address], [phone], 1st & 3rd Saturday   every month,  10am-1pm ° °1) Find a Doctor and Pay Out of Pocket °Although you won't have to find out who is covered by your insurance plan, it is a good idea to ask around and get recommendations. You will then need to call the office and see if the doctor you have chosen will accept you as a new patient and what types of options they offer for patients who are self-pay. Some doctors offer discounts or will set up payment plans for their patients who do not have insurance, but you will need to ask so you aren't surprised when you get to your appointment. ° °2) Contact Your Local Health Department °Not all health departments have doctors that can see patients for sick visits, but many do, so it is worth a call to see if yours does. If you don't know where your local health department is, you can check in your phone book. The CDC also has a tool to help you locate your state's health department, and many state websites also have listings of all of their local health departments. ° °3) Find a Walk-in Clinic °If your illness is not likely to be very severe or complicated, you may want to try a walk in clinic. These are popping up all over the country in pharmacies, drugstores, and shopping centers. They're usually staffed by nurse practitioners or physician assistants that have been trained to treat common illnesses and complaints. They're usually fairly quick and inexpensive. However, if you have serious medical issues or chronic medical problems, these are probably not your best option ° °STD Testing °- [place] [facility], [address], [place], phone [phone] or 1-877-539-9860.  Monday - Friday, call for an appointment. °- [place] [facility] [place], [facility], [address], [place], phone [phone] or 1-877-539-9860.  Monday - Friday, call for an appointment. ° °Abuse/Neglect: °- [place] Child Abuse Hotline (336)  641-3795 °- [place] Child Abuse Hotline 800-378-5315 (After Hours) ° °Emergency Shelter:  [facility] (336) 271-5985 ° °Maternity Homes: °- [facility] (336) 275-9566 °- [facility] (704) 372-4663 ° °MRSA Hotline #:   832-7006 ° °[place] Resources ° °[facility] [place]  [employer] [place] [facility] °[address]                   [address]         [address]  °[place]                                               [place]                              [place] °Phone:  349-3220                                  Phone:  342-7768                   Phone:  342-8140 ° °[facility] [facility], 342-8316 °- [place] [facility] - [facility]- 1-888-581-9988 °      -     [facility] in [place], [address],                                  336-349-4454, Insurance ° °[place] Child Abuse Hotline °(336) 342-1394 or (336) 342-3537 (After Hours) ° ° °Behavioral Health Services ° °Substance Abuse Resources: °- [facility]  336-882-2125 °- [facility] 336-784-9470 °- [facility] 336-285-9073 °- [facility] 336-845-3988 °- Residential & Outpatient Substance Abuse Program  800-659-3381 ° °Psychological Services: °- [facility]  832-9600 °- [facility]  378-7881 °- [facility], [address], [place], ACCESS LINE: 1-800-853-5163 or 336-641-4981, Http://www.[facility]/services/adult.htm ° °Dental Assistance ° °If unable to pay or uninsured, contact:  [facility] or [place] [facility] to become qualified for the adult dental clinic. ° °Patients with Medicaid: [facility] °[address], [phone] °[address], [phone] ° °If unable to pay, or uninsured, contact [facility] (271-5999) or [place] [facility] (641-3152 in [place], 842-7733 in [place]) to become qualified for the adult dental clinic ° °Other Low-Cost Community Dental Services: °- [facility]- [address], [place], [place], [address], 723-1848, Ext. [address], 2nd and 4th Thursday of the month at 6:30am.  10 clients each day by appointment, can sometimes see walk-in patients if someone does not show for an appointment. °- [facility]- [address], [place], [place], [address], 723-7904 °- [facility]- [address], [place], [place], [address], 631-2330 °- [place] [facility]- 342-8273 °- [facility]- 703-3100 °- [facility]- 570-6415 ° ° ° ° ° °

## 2014-03-16 NOTE — ED Notes (Signed)
Pt. Stable, airway intact. No respiratory distress noted. Denies SOB or chest pain. Urticaria significantly decreased in size and redness. Pt. Ambulatory. Verbalized understanding of discharge instructions and information about allergic reactions.

## 2014-03-17 NOTE — Telephone Encounter (Signed)
I fax over his information to [facility] they will contact the patient for his appointment. CLS

## 2014-03-17 NOTE — Telephone Encounter (Signed)
Ok, refer to allergist

## 2014-03-22 DIAGNOSIS — I10 Essential (primary) hypertension: Secondary | ICD-10-CM

## 2014-03-22 DIAGNOSIS — E785 Hyperlipidemia, unspecified: Secondary | ICD-10-CM

## 2014-03-22 HISTORY — DX: Hyperlipidemia, unspecified: E78.5

## 2014-03-22 HISTORY — DX: Essential (primary) hypertension: I10

## 2014-04-06 ENCOUNTER — Ambulatory Visit: Payer: BC Managed Care – PPO | Admitting: Medical

## 2014-04-07 ENCOUNTER — Encounter: Payer: Self-pay | Admitting: Medical

## 2014-04-07 ENCOUNTER — Ambulatory Visit (INDEPENDENT_AMBULATORY_CARE_PROVIDER_SITE_OTHER): Payer: BC Managed Care – PPO | Admitting: Medical

## 2014-04-07 VITALS — BP 130/90 | HR 80 | Temp 98.7°F | Resp 16 | Wt 235.0 lb

## 2014-04-07 DIAGNOSIS — T7840XA Allergy, unspecified, initial encounter: Secondary | ICD-10-CM

## 2014-04-07 DIAGNOSIS — R7989 Other specified abnormal findings of blood chemistry: Secondary | ICD-10-CM

## 2014-04-07 DIAGNOSIS — R945 Abnormal results of liver function studies: Principal | ICD-10-CM

## 2014-04-07 DIAGNOSIS — F17200 Nicotine dependence, unspecified, uncomplicated: Secondary | ICD-10-CM

## 2014-04-07 DIAGNOSIS — M25562 Pain in left knee: Secondary | ICD-10-CM

## 2014-04-07 DIAGNOSIS — E785 Hyperlipidemia, unspecified: Secondary | ICD-10-CM

## 2014-04-07 DIAGNOSIS — R030 Elevated blood-pressure reading, without diagnosis of hypertension: Secondary | ICD-10-CM

## 2014-04-07 DIAGNOSIS — M25569 Pain in unspecified knee: Secondary | ICD-10-CM

## 2014-04-07 LAB — LIPID PANEL
CHOLESTEROL: 275 mg/dL — AB (ref 0–200)
HDL: 67 mg/dL (ref >39)
LDL CALC: 172 mg/dL — AB (ref 0–99)
TRIGLYCERIDES: 180 mg/dL — AB (ref <150)
Total CHOL/HDL Ratio: 4.1 Ratio
VLDL: 36 mg/dL (ref 0–40)

## 2014-04-07 LAB — HEPATIC FUNCTION PANEL
ALT: 53 U/L (ref 0–53)
AST: 38 U/L — ABNORMAL HIGH (ref 0–37)
Albumin: 5.1 g/dL (ref 3.5–5.2)
Alkaline Phosphatase: 54 U/L (ref 39–117)
BILIRUBIN DIRECT: 0.2 mg/dL (ref 0.0–0.3)
BILIRUBIN INDIRECT: 1.6 mg/dL — AB (ref 0.2–1.2)
TOTAL PROTEIN: 7.7 g/dL (ref 6.0–8.3)
Total Bilirubin: 1.8 mg/dL — ABNORMAL HIGH (ref 0.2–1.2)

## 2014-04-07 NOTE — Progress Notes (Signed)
   Subjective:   Wesley Cruz is a 33 y.o. male presenting on 04/07/2014 with Follow-up  Here for f/u   Since last visit ended up in the ED for allergic reaction from insect sting/bite.   Had hives, ended up having to use Epipen.  Ended up seeing allergist in f/u with our referral.  Has f/u in July.   Elevated BP - using no added salt, being careful with salt.  Exercise - walking briskly daily with his dogs.  Active on the job.    Elevated LFTs on last visit - has cut back on cholesterol.  Tobacco use - still smoking but trying to smoke less, drinking less.  Just found out wife is pregnant.  This is motivating him to cut back.  Chronic knee pain - went to orthopedics on [facility], had PT for weeks.  Still having problems with this.    No other aggravating or relieving factors.  No other complaint.  Review of Systems ROS as in subjective      Objective:    BP 130/90  Pulse 80  Temp(Src) 98.7 F (37.1 C) (Oral)  Resp 16  Wt 235 lb (106.595 kg)  Wt Readings from Last 3 Encounters:  04/07/14 235 lb (106.595 kg)  10/09/13 237 lb (107.502 kg)  09/21/13 234 lb (106.142 kg)   BP Readings from Last 3 Encounters:  04/07/14 130/90  03/16/14 142/92  10/09/13 150/78    General appearance: alert, no distress, WD/WN Neck: supple, no lymphadenopathy, no thyromegaly, no masses Heart: RRR, normal S1, S2, no murmurs Lungs: CTA bilaterally, no wheezes, rhonchi, or rales Abdomen: +bs, soft, non tender, non distended, no masses, no hepatomegaly, no splenomegaly Pulses: 2+ symmetric, upper and lower extremities, normal cap refill Ext: no edema     Assessment: Encounter Diagnoses  Name Primary?  . Elevated LFTs Yes  . Elevated blood pressure reading without diagnosis of hypertension   . Tobacco use disorder   . Dyslipidemia   . Allergic reaction   . Knee pain, left      Plan: Elevated LFTs - repeat labs Elevated BP - improved but not at goal.  reluctanct to start  medication.  Discussed risks of elevated BP.   He will check BP and bring me readings in 65mo Tobacco use - c/t efforts to stop tobacco Dyslipidemia - after he has improved diet, will recheck lipids today Allergic reaction - f/u with allergist Knee pain - f/u with ortho congratulated him on him and wife being pregnant, just found out last week  Bryce was seen today for follow-up.  Diagnoses and associated orders for this visit:  Elevated LFTs - Hepatic Function Panel  Elevated blood pressure reading without diagnosis of hypertension  Tobacco use disorder  Dyslipidemia - Lipid panel  Allergic reaction  Knee pain, left     Return pending labs.

## 2014-04-09 ENCOUNTER — Other Ambulatory Visit: Payer: Self-pay | Admitting: Medical

## 2014-04-09 MED ORDER — ATORVASTATIN CALCIUM 20 MG PO TABS: 20.0000 mg | ORAL_TABLET | Freq: Every day | ORAL | Status: DC

## 2014-05-10 ENCOUNTER — Encounter: Payer: Self-pay | Admitting: Medical

## 2014-05-10 ENCOUNTER — Ambulatory Visit (INDEPENDENT_AMBULATORY_CARE_PROVIDER_SITE_OTHER): Payer: 59 | Admitting: Medical

## 2014-05-10 VITALS — BP 152/100 | HR 80 | Temp 98.1°F | Resp 16 | Wt 234.0 lb

## 2014-05-10 DIAGNOSIS — E785 Hyperlipidemia, unspecified: Secondary | ICD-10-CM

## 2014-05-10 DIAGNOSIS — I10 Essential (primary) hypertension: Secondary | ICD-10-CM

## 2014-05-10 DIAGNOSIS — F17200 Nicotine dependence, unspecified, uncomplicated: Secondary | ICD-10-CM

## 2014-05-10 MED ORDER — LISINOPRIL 20 MG PO TABS: 20.0000 mg | ORAL_TABLET | Freq: Every day | ORAL | Status: DC

## 2014-05-10 NOTE — Progress Notes (Signed)
   Subjective:    Patient ID: Wesley Cruz, male    DOB: 1981/06/07, 33 y.o.   MRN: [id number]  HPI  Patient is presenting for 1 month follow up on BP and cholesterol.   Elevated BP - states that he has not checked his bp, he gave his bp cuff to his sister temporarily, has not gotten it back. Diet is inconsistent, he travels a lot, trying to avoid fast food, wife is helping him with diet choices. Exercise consists of walking dogs for about 1 mile 5 days a week, stays active with work but admits he could do more for exercise. Denies vision changes, chest pain, palpitations, dizziness, edema.   Cholesterol - Diet and exercise as above. Still smoking 1/2ppd (15 year history) sometimes more on the weekends. States that he is motivated to stop quitting since his wife is pregnant. Does not have a plan to quit smoking. Plans on seeing a therapist May 28, 2014 for stress and anxiety dealing with major life changes of having a child in the future. Also plans to discuss smoking cessation with that therapist. Denies sob, breathing difficulties. Patient also drinks 4-6 drinks per week, ~24 pack in a week.  Sees therapist soon for anxiety in general.    Up to date on Tdap, had it a few years ago after getting cut with nail.    No other aggravating or relieving factors.   Review of Systems As in subjective.    Objective:   Physical Exam  BP 152/100  Pulse 80  Temp(Src) 98.1 F (36.7 C) (Oral)  Resp 16  Wt 234 lb (106.142 kg)  BP Readings from Last 3 Encounters:  05/10/14 152/100  04/07/14 130/90  03/16/14 142/92    General appearance: alert, no distress, WD/WN, overweight Heart: RRR, normal S1, S2, no murmurs Lungs: CTA bilaterally, no wheezes, rhonchi, or rales Pulses: 2+ symmetric, upper and lower extremities, normal cap refill      Assessment & Plan:   Encounter Diagnoses  Name Primary?  . Essential hypertension, benign Yes  . Hyperlipidemia   . Tobacco use disorder      HTN - discussed elevated BP since 2013, discussed some of the measures he has done such as cutting out salt, cutting back on ETOH and tobacco.   Encouraged him to c/t these efforts.  Consider medication to quit tobacco.  Begin Lisinopril 20mg  daily.   discussed risks/benefits of medications.    Recheck 82mo  Advised he check status of Tdap for wife's scae since she is due with child in 11/2014.   Hyperlipidemia - c/t Lipitor, recheck labs in 59mo.   tobacco - c/t efforts to stop tobacco.  Patient was seen in conjunction with PA student Jaynee Eagles, and I have also evaluated and examined patient, agree with student's notes, student supervised by me.

## 2014-06-09 ENCOUNTER — Encounter: Payer: Self-pay | Admitting: Medical

## 2014-06-09 ENCOUNTER — Ambulatory Visit (INDEPENDENT_AMBULATORY_CARE_PROVIDER_SITE_OTHER): Payer: 59 | Admitting: Medical

## 2014-06-09 VITALS — BP 140/100 | HR 76 | Temp 98.0°F | Resp 16 | Wt 235.0 lb

## 2014-06-09 DIAGNOSIS — I10 Essential (primary) hypertension: Secondary | ICD-10-CM

## 2014-06-09 DIAGNOSIS — E785 Hyperlipidemia, unspecified: Secondary | ICD-10-CM

## 2014-06-09 DIAGNOSIS — F17200 Nicotine dependence, unspecified, uncomplicated: Secondary | ICD-10-CM

## 2014-06-09 NOTE — Progress Notes (Signed)
   Subjective:   Wesley Cruz is a 33 y.o. male presenting on 06/09/2014 with follow-up  Here for recheck.  Last visit we started Lisinopril and Lipitor. He is good about taking the medication most days.   He is still smoking.  He has begun counseling.  He is frustrated, hates his job, wants to get into sales, needs a change in his life . Stressed about his new baby on the way.   No other aggravating or relieving factors.  No other complaint.  Review of Systems ROS as in subjective      Objective:    Filed Vitals:   06/09/14 0831  BP: 140/100  Pulse: 76  Temp: 98 F (36.7 C)  Resp: 16    General appearance: alert, no distress, WD/WN Heart: RRR, normal S1, S2, no murmurs Lungs: CTA bilaterally, no wheezes, rhonchi, or rales Pulses: 2+ symmetric, upper and lower extremities, normal cap refill Ext: no edema      Assessment: Encounter Diagnoses  Name Primary?  . Essential hypertension, benign Yes  . Hyperlipidemia   . Tobacco use disorder      Plan: HTN - compliant with lisinopril, advised he check BP outside of office given white coat syndrome.   Exercise, eat healthy, try to stop tobacco.  Counseled about job hunting, resume, as he is frustrated with his job, wants to pursue sales/new direction  Hyperlipidemia - compliant with Lipitor, labs today  Tobacco use - c/t efforts to stop tobacco, c/t counseling  Wesley Cruz was seen today for follow-up.  Diagnoses and associated orders for this visit:  Essential hypertension, benign - Lipid panel - Comprehensive metabolic panel  Hyperlipidemia - Lipid panel - Comprehensive metabolic panel  Tobacco use disorder - Lipid panel - Comprehensive metabolic panel     Return pending labs.

## 2014-06-10 ENCOUNTER — Other Ambulatory Visit: Payer: Self-pay | Admitting: Medical

## 2014-06-10 ENCOUNTER — Ambulatory Visit: Payer: 59 | Admitting: Medical

## 2014-06-10 LAB — COMPREHENSIVE METABOLIC PANEL
ALK PHOS: 51 U/L (ref 39–117)
ALT: 56 U/L — ABNORMAL HIGH (ref 0–53)
AST: 34 U/L (ref 0–37)
Albumin: 5.2 g/dL (ref 3.5–5.2)
BUN: 11 mg/dL (ref 6–23)
CO2: 25 mEq/L (ref 19–32)
Calcium: 10 mg/dL (ref 8.4–10.5)
Chloride: 102 mEq/L (ref 96–112)
Creat: 0.72 mg/dL (ref 0.50–1.35)
GLUCOSE: 99 mg/dL (ref 70–99)
POTASSIUM: 4.8 meq/L (ref 3.5–5.3)
Sodium: 138 mEq/L (ref 135–145)
Total Bilirubin: 1 mg/dL (ref 0.2–1.2)
Total Protein: 7.5 g/dL (ref 6.0–8.3)

## 2014-06-10 LAB — LIPID PANEL
CHOL/HDL RATIO: 4.6 ratio
CHOLESTEROL: 266 mg/dL — AB (ref 0–200)
HDL: 58 mg/dL (ref >39)
LDL Cholesterol: 133 mg/dL — ABNORMAL HIGH (ref 0–99)
TRIGLYCERIDES: 375 mg/dL — AB (ref <150)
VLDL: 75 mg/dL — ABNORMAL HIGH (ref 0–40)

## 2014-06-10 MED ORDER — ROSUVASTATIN CALCIUM 20 MG PO TABS: 20.0000 mg | ORAL_TABLET | Freq: Every day | ORAL | Status: DC

## 2014-06-14 ENCOUNTER — Other Ambulatory Visit: Payer: Self-pay | Admitting: Medical

## 2014-06-14 MED ORDER — ATORVASTATIN CALCIUM 40 MG PO TABS: 40.0000 mg | ORAL_TABLET | Freq: Every day | ORAL | Status: DC

## 2014-06-24 ENCOUNTER — Telehealth: Payer: Self-pay

## 2014-06-24 NOTE — Telephone Encounter (Signed)
Spoke with patient, he needs [facility]. Appointment is September 23. He has new insurance, [employer]. Advised patient to fax [place] his new card so we can start the process. He is not at a fax machine but he will fax/call back soon.

## 2014-12-11 ENCOUNTER — Ambulatory Visit (INDEPENDENT_AMBULATORY_CARE_PROVIDER_SITE_OTHER): Payer: 59 | Admitting: Family Medicine

## 2014-12-11 VITALS — BP 142/98 | HR 97 | Temp 98.7°F | Resp 20 | Ht 74.75 in | Wt 225.2 lb

## 2014-12-11 DIAGNOSIS — K648 Other hemorrhoids: Secondary | ICD-10-CM

## 2014-12-11 DIAGNOSIS — K644 Residual hemorrhoidal skin tags: Secondary | ICD-10-CM

## 2014-12-11 DIAGNOSIS — R21 Rash and other nonspecific skin eruption: Secondary | ICD-10-CM

## 2014-12-11 DIAGNOSIS — B3789 Other sites of candidiasis: Secondary | ICD-10-CM

## 2014-12-11 DIAGNOSIS — K602 Anal fissure, unspecified: Secondary | ICD-10-CM

## 2014-12-11 LAB — POCT SKIN KOH: SKIN KOH, POC: NEGATIVE

## 2014-12-11 MED ORDER — CLOTRIMAZOLE-BETAMETHASONE 1-0.05 % EX CREA: 1.0000 "application " | TOPICAL_CREAM | Freq: Two times a day (BID) | CUTANEOUS | Status: DC

## 2014-12-11 MED ORDER — LIDOCAINE (ANORECTAL) 5 % EX GEL
CUTANEOUS | Status: DC
Start: 2014-12-11 — End: 2014-12-17

## 2014-12-11 MED ORDER — DILTIAZEM GEL 2 %: 1.0000 "application " | Freq: Three times a day (TID) | CUTANEOUS | Status: DC | PRN

## 2014-12-11 MED ORDER — FLUCONAZOLE 150 MG PO TABS
150.0000 mg | ORAL_TABLET | Freq: Once | ORAL | Status: DC
Start: 2014-12-11 — End: 2014-12-17

## 2014-12-11 NOTE — Progress Notes (Signed)
Subjective:    Patient ID: Wesley Cruz, male    DOB: [birth year], 34 y.o.   MRN: [id number]  HPI Wesley Cruz is a 34 y.o. male  Complains of rectal pain with bowel movements. There for about 2 weeks.  Thought hemorrhoids, but has not felt large bump. Has had bright red blood with BM. Intense pain with BM.  30 minutes after BM - itching, burning, pain.  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 Has been trying to avoid BM in morning - waiting . Has had solid stools, but not hard stool recently.   Has been driving more recently for work.   Has been having patches of eczema, notes on arms, legs during the winter - small patches.   Sister had psoriasis when younger.   Tx: metamucil, fiber supplement, stool softener, milk of magnesia. preparation H - min relief for 1 hour.    There are no active problems to display for this patient.  Past Medical History  Diagnosis Date  . Bee sting-induced anaphylaxis 2013  . Laceration 2009    right hand  . Seizure 2009    questionable seizure, syncope, one episode, none since - seen at [facility] ED  . Hyperlipidemia 6/15  . Hypertension 6/15  . Tobacco use   . Allergy    Past Surgical History  Procedure Laterality Date  . Laceration repair  2009    right dorsal hand   Allergies  Allergen Reactions  . Hornet Venom Anaphylaxis    Japanese hornet   Prior to Admission medications   Medication Sig Start Date End Date Taking? Authorizing Provider  amphetamine-dextroamphetamine (ADDERALL XR) 15 MG 24 hr capsule Take 15 mg by mouth every morning.   Yes Historical Provider, MD  atorvastatin (LIPITOR) 40 MG tablet Take 1 tablet (40 mg total) by mouth daily. 06/14/14  Yes Camelia Eng Tysinger, PA-C  EPINEPHrine (EPIPEN 2-PAK) 0.3 mg/0.3 mL IJ SOAJ injection Inject 0.3 mLs (0.3 mg total) into the muscle once as needed (for severe allergic reaction). CAll 911 immediately if you have to use this medicine 03/16/14  Yes Johnna Acosta, MD  ibuprofen (ADVIL) 200 MG tablet Take 800 mg by mouth every 6 (six) hours as needed for moderate pain.    Yes Historical Provider, MD  lisinopril (PRINIVIL,ZESTRIL) 20 MG tablet Take 1 tablet (20 mg total) by mouth daily. 05/10/14  Yes Camelia Eng Tysinger, PA-C  diphenhydrAMINE (BENADRYL) 25 MG tablet Take 1 tablet (25 mg total) by mouth every 6 (six) hours as needed for itching (Rash). Patient not taking: Reported on 12/11/2014 03/16/14   Johnna Acosta, MD  famotidine (PEPCID) 20 MG tablet Take 1 tablet (20 mg total) by mouth 2 (two) times daily. Patient not taking: Reported on 12/11/2014 03/16/14   Johnna Acosta, MD   History   Social History  . Marital Status: Married    Spouse Name: N/A  . Number of Children: N/A  . Years of Education: N/A   Occupational History  . Not on file.   Social History Main Topics  . Smoking status: Current Every Day Smoker -- 0.50 packs/day for 12 years    Types: Cigarettes  . Smokeless tobacco: Not on file  . Alcohol Use: 18.0 oz/week    30 Cans of beer per week  . Drug Use: No  . Sexual Activity: Not on file   Other Topics Concern  . Not on file   Social History Narrative  Married, has dogs, no children, exercise - walking, heavy lifting on the job, [employer]       Review of Systems  Constitutional: Negative for fever, chills, diaphoresis and unexpected weight change.  Gastrointestinal: Positive for blood in stool (at end of stool only. ), anal bleeding and rectal pain. Negative for nausea, vomiting, abdominal pain, diarrhea, constipation and abdominal distention.  Genitourinary: Negative for hematuria and difficulty urinating.  Skin: Positive for rash.       Irritation around anus - started 2 weeks ago.        Objective:   Physical Exam  Constitutional: He is oriented to person, place, and time. He appears well-developed and well-nourished. No distress.  HENT:  Head: Normocephalic and atraumatic.  Pulmonary/Chest: Effort  normal.  Abdominal: Soft. Bowel sounds are normal. There is no tenderness.  Genitourinary: Rectal exam shows external hemorrhoid (small hemoorhoid L side approximately 9 0'clock. no active bleeding, no visualized fissure, but ttp and guarded exam. ) and anal tone abnormal.        Neurological: He is alert and oriented to person, place, and time.  Skin: Skin is warm and dry. Rash noted.     Few scattered oval/round erythematous hyperpigmented patches with faint scale on lower legs and forearms.   Vitals reviewed.  Filed Vitals:   12/11/14 1107  BP: 142/98  Pulse: 97  Temp: 98.7 F (37.1 C)  TempSrc: Oral  Resp: 20  Height: 6' 2.75" (1.899 m)  Weight: 225 lb 3.2 oz (102.15 kg)  SpO2: 98%    Results for orders placed or performed in visit on 12/11/14  POCT Skin KOH  Result Value Ref Range   Skin KOH, POC Negative        Assessment & Plan:   Wesley Cruz is a 34 y.o. male Perianal rash - Plan: POCT Skin KOH, clotrimazole-betamethasone (LOTRISONE) cream  External hemorrhoids - Plan: POCT Skin KOH  Anal fissure - Plan: diltiazem 2 % GEL, Lidocaine, Anorectal, 5 % GEL  Perianal candidiasis - Plan: fluconazole (DIFLUCAN) 150 MG tablet, clotrimazole-betamethasone (LOTRISONE) cream   Suspected underlying psoriasis, vs prior diagnosis of eczema/nummular eczema. He does have small patches on penile head and legs, arms suspicious for psoriasis. Perianal rash may be inverse psoriasis now with secondary candidal infection with satellite lesions, small external hemorrhoid, but history more suggestive of anal fissure.  Difficult exam d/t pain, so fissure not visualized in office, but no acute bleeding noted.   -diflucan once (hold Lipitor), then lotrisone BID to perianal area  -lidocaine and diltiazem gel for suspected fissure.   -sx care discussed.   -can continue topical steroid rx from other provider for arm/leg rash, hydrocortisone otc to penile lesions and follow up with  primary provider or derm for possible biopsy of other rash as suspicious for psoriasis.   -recehck next week if not improving, sooner if worse. - RTC precautions.    Meds ordered this encounter  Medications  . amphetamine-dextroamphetamine (ADDERALL XR) 15 MG 24 hr capsule    Sig: Take 15 mg by mouth every morning.  . diltiazem 2 % GEL    Sig: Apply 1 application topically 3 (three) times daily as needed. Pea sized amount    Dispense:  30 g    Refill:  0  . Lidocaine, Anorectal, 5 % GEL    Sig: Apply pea sized amount TID prn to affected area    Dispense:  30 g    Refill:  0  . fluconazole (DIFLUCAN) 150  MG tablet    Sig: Take 1 tablet (150 mg total) by mouth once.    Dispense:  1 tablet    Refill:  0  . clotrimazole-betamethasone (LOTRISONE) cream    Sig: Apply 1 application topically 2 (two) times daily.    Dispense:  30 g    Refill:  0   Patient Instructions  One dose of Diflucan 150mg  to treat fungal infection. Do not take lipitor next 3 days due to risk of this medicine and diflucan.  Start lotrisone cream twice per day. This has antifungal and steroid cream to treat both possible fungal infection and inflammation and itching/soreness of area.  Two creams prescribed to place at anal area only up to three times per day  - especially prior to bowel movements as discussed, as I see a small hemorrhoid, but there may be some fissure or tearing of area as well. Diltiazem helps relax muscle, and lidocaine numbs area.    Try to avoid prolonged sitting, continue stool softener to keep bowel movements soft.   Your rash may be fungus (yeast), but also with the other rash, this can also be a form of psoriasis. Follow up with your primary provider as a dermatology evaluation or possible biopsy of these rashes may be needed to clarify if this is eczema or psoriasis.   Candida Infection A Candida infection (also called yeast, fungus, and Monilia infection) is an overgrowth of yeast that can  occur anywhere on the body. A yeast infection commonly occurs in warm, moist body areas. Usually, the infection remains localized but can spread to become a systemic infection. A yeast infection may be a sign of a more severe disease such as diabetes, leukemia, or AIDS. A yeast infection can occur in both men and women. In women, Candida vaginitis is a vaginal infection. It is one of the most common causes of vaginitis. Men usually do not have symptoms or know they have an infection until other problems develop. Men may find out they have a yeast infection because their sex partner has a yeast infection. Uncircumcised men are more likely to get a yeast infection than circumcised men. This is because the uncircumcised glans is not exposed to air and does not remain as dry as that of a circumcised glans. Older adults may develop yeast infections around dentures. CAUSES  Women  Antibiotics.  Steroid medication taken for a long time.  Being overweight (obese).  Diabetes.  Poor immune condition.  Certain serious medical conditions.  Immune suppressive medications for organ transplant patients.  Chemotherapy.  Pregnancy.  Menstruation.  Stress and fatigue.  Intravenous drug use.  Oral contraceptives.  Wearing tight-fitting clothes in the crotch area.  Catching it from a sex partner who has a yeast infection.  Spermicide.  Intravenous, urinary, or other catheters. Men  Catching it from a sex partner who has a yeast infection.  Having oral or anal sex with a person who has the infection.  Spermicide.  Diabetes.  Antibiotics.  Poor immune system.  Medications that suppress the immune system.  Intravenous drug use.  Intravenous, urinary, or other catheters. SYMPTOMS  Women  Thick, white vaginal discharge.  Vaginal itching.  Redness and swelling in and around the vagina.  Irritation of the lips of the vagina and perineum.  Blisters on the vaginal lips and  perineum.  Painful sexual intercourse.  Low blood sugar (hypoglycemia).  Painful urination.  Bladder infections.  Intestinal problems such as constipation, indigestion, bad breath, bloating, increase  in gas, diarrhea, or loose stools. Men  Men may develop intestinal problems such as constipation, indigestion, bad breath, bloating, increase in gas, diarrhea, or loose stools.  Dry, cracked skin on the penis with itching or discomfort.  Jock itch.  Dry, flaky skin.  Athlete's foot.  Hypoglycemia. DIAGNOSIS  Women  A history and an exam are performed.  The discharge may be examined under a microscope.  A culture may be taken of the discharge. Men  A history and an exam are performed.  Any discharge from the penis or areas of cracked skin will be looked at under the microscope and cultured.  Stool samples may be cultured. TREATMENT  Women  Vaginal antifungal suppositories and creams.  Medicated creams to decrease irritation and itching on the outside of the vagina.  Warm compresses to the perineal area to decrease swelling and discomfort.  Oral antifungal medications.  Medicated vaginal suppositories or cream for repeated or recurrent infections.  Wash and dry the irritation areas before applying the cream.  Eating yogurt with Lactobacillus may help with prevention and treatment.  Sometimes painting the vagina with gentian violet solution may help if creams and suppositories do not work. Men  Antifungal creams and oral antifungal medications.  Sometimes treatment must continue for 30 days after the symptoms go away to prevent recurrence. HOME CARE INSTRUCTIONS  Women  Use cotton underwear and avoid tight-fitting clothing.  Avoid colored, scented toilet paper and deodorant tampons or pads.  Do not douche.  Keep your diabetes under control.  Finish all the prescribed medications.  Keep your skin clean and dry.  Consume milk or yogurt with  Lactobacillus-active culture regularly. If you get frequent yeast infections and think that is what the infection is, there are over-the-counter medications that you can get. If the infection does not show healing in 3 days, talk to your caregiver.  Tell your sex partner you have a yeast infection. Your partner may need treatment also, especially if your infection does not clear up or recurs. Men  Keep your skin clean and dry.  Keep your diabetes under control.  Finish all prescribed medications.  Tell your sex partner that you have a yeast infection so he or she can be treated if necessary. SEEK MEDICAL CARE IF:   Your symptoms do not clear up or worsen in one week after treatment.  You have an oral temperature above 102 F (38.9 C).  You have trouble swallowing or eating for a prolonged time.  You develop blisters on and around your vagina.  You develop vaginal bleeding and it is not your menstrual period.  You develop abdominal pain.  You develop intestinal problems as mentioned above.  You get weak or light-headed.  You have painful or increased urination.  You have pain during sexual intercourse. MAKE SURE YOU:   Understand these instructions.  Will watch your condition.  Will get help right away if you are not doing well or get worse. Document Released: 11/15/2004 Document Revised: 02/22/2014 Document Reviewed: 02/27/2010 [facility] Patient Information 2015 [place], [place]. This information is not intended to replace advice given to you by your health care provider. Make sure you discuss any questions you have with your health care provider.  Hemorrhoids Hemorrhoids are swollen veins around the rectum or anus. There are two types of hemorrhoids:   Internal hemorrhoids. These occur in the veins just inside the rectum. They may poke through to the outside and become irritated and painful.  External hemorrhoids. These occur  in the veins outside the anus and can be  felt as a painful swelling or hard lump near the anus. CAUSES  Pregnancy.   Obesity.   Constipation or diarrhea.   Straining to have a bowel movement.   Sitting for long periods on the toilet.  Heavy lifting or other activity that caused you to strain.  Anal intercourse. SYMPTOMS   Pain.   Anal itching or irritation.   Rectal bleeding.   Fecal leakage.   Anal swelling.   One or more lumps around the anus.  DIAGNOSIS  Your caregiver may be able to diagnose hemorrhoids by visual examination. Other examinations or tests that may be performed include:   Examination of the rectal area with a gloved hand (digital rectal exam).   Examination of anal canal using a small tube (scope).   A blood test if you have lost a significant amount of blood.  A test to look inside the colon (sigmoidoscopy or colonoscopy). TREATMENT Most hemorrhoids can be treated at home. However, if symptoms do not seem to be getting better or if you have a lot of rectal bleeding, your caregiver may perform a procedure to help make the hemorrhoids get smaller or remove them completely. Possible treatments include:   Placing a rubber band at the base of the hemorrhoid to cut off the circulation (rubber band ligation).   Injecting a chemical to shrink the hemorrhoid (sclerotherapy).   Using a tool to burn the hemorrhoid (infrared light therapy).   Surgically removing the hemorrhoid (hemorrhoidectomy).   Stapling the hemorrhoid to block blood flow to the tissue (hemorrhoid stapling).  HOME CARE INSTRUCTIONS   Eat foods with fiber, such as whole grains, beans, nuts, fruits, and vegetables. Ask your doctor about taking products with added fiber in them (fibersupplements).  Increase fluid intake. Drink enough water and fluids to keep your urine clear or pale yellow.   Exercise regularly.   Go to the bathroom when you have the urge to have a bowel movement. Do not wait.   Avoid  straining to have bowel movements.   Keep the anal area dry and clean. Use wet toilet paper or moist towelettes after a bowel movement.   Medicated creams and suppositories may be used or applied as directed.   Only take over-the-counter or prescription medicines as directed by your caregiver.   Take warm sitz baths for 15-20 minutes, 3-4 times a day to ease pain and discomfort.   Place ice packs on the hemorrhoids if they are tender and swollen. Using ice packs between sitz baths may be helpful.   Put ice in a plastic bag.   Place a towel between your skin and the bag.   Leave the ice on for 15-20 minutes, 3-4 times a day.   Do not use a donut-shaped pillow or sit on the toilet for long periods. This increases blood pooling and pain.  SEEK MEDICAL CARE IF:  You have increasing pain and swelling that is not controlled by treatment or medicine.  You have uncontrolled bleeding.  You have difficulty or you are unable to have a bowel movement.  You have pain or inflammation outside the area of the hemorrhoids. MAKE SURE YOU:  Understand these instructions.  Will watch your condition.  Will get help right away if you are not doing well or get worse. Document Released: 10/05/2000 Document Revised: 09/24/2012 Document Reviewed: 08/12/2012 [facility] Patient Information 2015 [place], [place]. This information is not intended to replace advice given to  you by your health care provider. Make sure you discuss any questions you have with your health care provider.   Anal Fissure, Adult An anal fissure is a small tear or crack in the skin around the anus. Bleeding from a fissure usually stops on its own within a few minutes. However, bleeding will often reoccur with each bowel movement until the crack heals.  CAUSES   Passing large, hard stools.  Frequent diarrheal stools.  Constipation.  Inflammatory bowel disease (Crohn's disease or ulcerative  colitis).  Infections.  Anal sex. SYMPTOMS   Small amounts of blood seen on your stools, on toilet paper, or in the toilet after a bowel movement.  Rectal bleeding.  Painful bowel movements.  Itching or irritation around the anus. DIAGNOSIS Your caregiver will examine the anal area. An anal fissure can usually be seen with careful inspection. A rectal exam may be performed and a short tube (anoscope) may be used to examine the anal canal. TREATMENT   You may be instructed to take fiber supplements. These supplements can soften your stool to help make bowel movements easier.  Sitz baths may be recommended to help heal the tear. Do not use soap in the sitz baths.  A medicated cream or ointment may be prescribed to lessen discomfort. HOME CARE INSTRUCTIONS   Maintain a diet high in fruits, whole grains, and vegetables. Avoid constipating foods like bananas and dairy products.  Take sitz baths as directed by your caregiver.  Drink enough fluids to keep your urine clear or pale yellow.  Only take over-the-counter or prescription medicines for pain, discomfort, or fever as directed by your caregiver. Do not take aspirin as this may increase bleeding.  Do not use ointments containing numbing medications (anesthetics) or hydrocortisone. They could slow healing. SEEK MEDICAL CARE IF:   Your fissure is not completely healed within 3 days.  You have further bleeding.  You have a fever.  You have diarrhea mixed with blood.  You have pain.  Your problem is getting worse rather than better. MAKE SURE YOU:   Understand these instructions.  Will watch your condition.  Will get help right away if you are not doing well or get worse. Document Released: 10/08/2005 Document Revised: 12/31/2011 Document Reviewed: 03/25/2011 [facility] Patient Information 2015 [place], [place]. This information is not intended to replace advice given to you by your health care provider. Make sure you  discuss any questions you have with your health care provider.   Psoriasis Psoriasis is a common, long-lasting (chronic) inflammation of the skin. It affects both men and women equally, of all ages and all races. Psoriasis cannot be passed from person to person (not contagious). Psoriasis varies from mild to very severe. When severe, it can greatly affect your quality of life. Psoriasis is an inflammatory disorder affecting the skin as well as other organs including the joints (causing an arthritis). With psoriasis, the skin sheds its top layer of cells more rapidly than it does in someone without psoriasis. CAUSES  The cause of psoriasis is largely unknown. Genetics, your immune system, and the environment seem to play a role in causing psoriasis. Factors that can make psoriasis worse include:  Damage or trauma to the skin, such as cuts, scrapes, and sunburn. This damage often causes new areas of psoriasis (lesions).  Winter dryness and lack of sunlight.  Medicines such as lithium, beta-blockers, antimalarial drugs, ACE inhibitors, nonsteroidal anti-inflammatory drugs (ibuprofen, aspirin), and terbinafine. Let your caregiver know if you are taking  any of these drugs.  Alcohol. Excessive alcohol use should be avoided if you have psoriasis. Drinking large amounts of alcohol can affect:  How well your psoriasis treatment works.  How safe your psoriasis treatment is.  Smoking. If you smoke, ask your caregiver for help to quit.  Stress.  Bacterial or viral infections.  Arthritis. Arthritis associated with psoriasis (psoriatic arthritis) affects less than 10% of patients with psoriasis. The arthritic intensity does not always match the skin psoriasis intensity. It is important to let your caregiver know if your joints hurt or if they are stiff. SYMPTOMS  The most common form of psoriasis begins with little red bumps that gradually become larger. The bumps begin to form scales that flake off  easily. The lower layers of scales stick together. When these scales are scratched or removed, the underlying skin is tender and bleeds easily. These areas then grow in size and may become large. Psoriasis often creates a rash that looks the same on both sides of the body (symmetrical). It often affects the elbows, knees, groin, genitals, arms, legs, scalp, and nails. Affected nails often have pitting, loosen, thicken, crumble, and are difficult to treat.  "Inverse psoriasis"occurs in the armpits, under breasts, in skin folds, and around the groin, buttocks, and genitals.  "Guttate psoriasis" generally occurs in children and young adults following a recent sore throat (strep throat). It begins with many small, red, scaly spots on the skin. It clears spontaneously in weeks or a few months without treatment. DIAGNOSIS  Psoriasis is diagnosed by physical exam. A tissue sample (biopsy) may also be taken. TREATMENT The treatment of psoriasis depends on your age, health, and living conditions.  Steroid (cortisone) creams, lotions, and ointments may be used. These treatments are associated with thinning of the skin, blood vessels that get larger (dilated), loss of skin pigmentation, and easy bruising. It is important to use these steroids as directed by your caregiver. Only treat the affected areas and not the normal, unaffected skin. People on long-term steroid treatment should wear a medical alert bracelet. Injections may be used in areas that are difficult to treat.  Scalp treatments are available as shampoos, solutions, sprays, foams, and oils. Avoid scratching the scalp and picking at the scales.  Anthralin medicine works well on areas that are difficult to treat. However, it stains clothes and skin and may cause temporary irritation.  Synthetic vitamin D (calcipotriene)can be used on small areas. It is available by prescription. The forms of synthetic vitamin D available in health food stores do not  help with psoriasis.  Coal tarsare available in various strengths for psoriasis that is difficult to treat. They are one of the longest used treatments for difficult to treat psoriasis. However, they are messy to use.  Light therapy (UV therapy) can be carefully and professionally monitored in a dermatologist's office. Careful sunbathing is helpful for many people as directed by your caregiver. The exposure should be just long enough to cause a mild redness (erythema) of your skin. Avoid sunburn as this may make the condition worse. Sunscreen (SPF of 30 or higher) should be used to protect against sunburn. Cataracts, wrinkles, and skin aging are some of the harmful side effects of light therapy.  If creams (topical medicines) fail, there are several other options for systemic or oral medicines your caregiver can suggest. Psoriasis can sometimes be very difficult to treat. It can come and go. It is necessary to follow up with your caregiver regularly if your psoriasis  is difficult to treat. Usually, with persistence you can get a good amount of relief. Maintaining consistent care is important. Do not change caregivers just because you do not see immediate results. It may take several trials to find the right combination of treatment for you. PREVENTING FLARE-UPS  Wear gloves while you wash dishes, while cleaning, and when you are outside in the cold.  If you have radiators, place a bowl of water or damp towel on the radiator. This will help put water back in the air. You can also use a humidifier to keep the air moist. Try to keep the humidity at about 60% in your home.  Apply moisturizer while your skin is still damp from bathing or showering. This traps water in the skin.  Avoid long, hot baths or showers. Keep soap use to a minimum. Soaps dry out the skin and wash away the protective oils. Use a fragrance free, dye free soap.  Drink enough water and fluids to keep your urine clear or pale  yellow. Not drinking enough water depletes your skin's water supply.  Turn off the heat at night and keep it low during the day. Cool air is less drying. SEEK MEDICAL CARE IF:  You have increasing pain in the affected areas.  You have uncontrolled bleeding in the affected areas.  You have increasing redness or warmth in the affected areas.  You start to have pain or stiffness in your joints.  You start feeling depressed about your condition.  You have a fever. Document Released: 10/05/2000 Document Revised: 12/31/2011 Document Reviewed: 04/02/2011 [facility] Patient Information 2015 [place], [place]. This information is not intended to replace advice given to you by your health care provider. Make sure you discuss any questions you have with your health care provider.

## 2014-12-11 NOTE — Patient Instructions (Addendum)
One dose of Diflucan 150mg  to treat fungal infection. Do not take lipitor next 3 days due to risk of this medicine and diflucan.  Start lotrisone cream twice per day. This has antifungal and steroid cream to treat both possible fungal infection and inflammation and itching/soreness of area.  Two creams prescribed to place at anal area only up to three times per day  - especially prior to bowel movements as discussed, as I see a small hemorrhoid, but there may be some fissure or tearing of area as well. Diltiazem helps relax muscle, and lidocaine numbs area.    Try to avoid prolonged sitting, continue stool softener to keep bowel movements soft.   Your rash may be fungus (yeast), but also with the other rash, this can also be a form of psoriasis. Follow up with your primary provider as a dermatology evaluation or possible biopsy of these rashes may be needed to clarify if this is eczema or psoriasis.   Candida Infection A Candida infection (also called yeast, fungus, and Monilia infection) is an overgrowth of yeast that can occur anywhere on the body. A yeast infection commonly occurs in warm, moist body areas. Usually, the infection remains localized but can spread to become a systemic infection. A yeast infection may be a sign of a more severe disease such as diabetes, leukemia, or AIDS. A yeast infection can occur in both men and women. In women, Candida vaginitis is a vaginal infection. It is one of the most common causes of vaginitis. Men usually do not have symptoms or know they have an infection until other problems develop. Men may find out they have a yeast infection because their sex partner has a yeast infection. Uncircumcised men are more likely to get a yeast infection than circumcised men. This is because the uncircumcised glans is not exposed to air and does not remain as dry as that of a circumcised glans. Older adults may develop yeast infections around dentures. CAUSES   Women  Antibiotics.  Steroid medication taken for a long time.  Being overweight (obese).  Diabetes.  Poor immune condition.  Certain serious medical conditions.  Immune suppressive medications for organ transplant patients.  Chemotherapy.  Pregnancy.  Menstruation.  Stress and fatigue.  Intravenous drug use.  Oral contraceptives.  Wearing tight-fitting clothes in the crotch area.  Catching it from a sex partner who has a yeast infection.  Spermicide.  Intravenous, urinary, or other catheters. Men  Catching it from a sex partner who has a yeast infection.  Having oral or anal sex with a person who has the infection.  Spermicide.  Diabetes.  Antibiotics.  Poor immune system.  Medications that suppress the immune system.  Intravenous drug use.  Intravenous, urinary, or other catheters. SYMPTOMS  Women  Thick, white vaginal discharge.  Vaginal itching.  Redness and swelling in and around the vagina.  Irritation of the lips of the vagina and perineum.  Blisters on the vaginal lips and perineum.  Painful sexual intercourse.  Low blood sugar (hypoglycemia).  Painful urination.  Bladder infections.  Intestinal problems such as constipation, indigestion, bad breath, bloating, increase in gas, diarrhea, or loose stools. Men  Men may develop intestinal problems such as constipation, indigestion, bad breath, bloating, increase in gas, diarrhea, or loose stools.  Dry, cracked skin on the penis with itching or discomfort.  Jock itch.  Dry, flaky skin.  Athlete's foot.  Hypoglycemia. DIAGNOSIS  Women  A history and an exam are performed.  The discharge  may be examined under a microscope.  A culture may be taken of the discharge. Men  A history and an exam are performed.  Any discharge from the penis or areas of cracked skin will be looked at under the microscope and cultured.  Stool samples may be cultured. TREATMENT   Women  Vaginal antifungal suppositories and creams.  Medicated creams to decrease irritation and itching on the outside of the vagina.  Warm compresses to the perineal area to decrease swelling and discomfort.  Oral antifungal medications.  Medicated vaginal suppositories or cream for repeated or recurrent infections.  Wash and dry the irritation areas before applying the cream.  Eating yogurt with Lactobacillus may help with prevention and treatment.  Sometimes painting the vagina with gentian violet solution may help if creams and suppositories do not work. Men  Antifungal creams and oral antifungal medications.  Sometimes treatment must continue for 30 days after the symptoms go away to prevent recurrence. HOME CARE INSTRUCTIONS  Women  Use cotton underwear and avoid tight-fitting clothing.  Avoid colored, scented toilet paper and deodorant tampons or pads.  Do not douche.  Keep your diabetes under control.  Finish all the prescribed medications.  Keep your skin clean and dry.  Consume milk or yogurt with Lactobacillus-active culture regularly. If you get frequent yeast infections and think that is what the infection is, there are over-the-counter medications that you can get. If the infection does not show healing in 3 days, talk to your caregiver.  Tell your sex partner you have a yeast infection. Your partner may need treatment also, especially if your infection does not clear up or recurs. Men  Keep your skin clean and dry.  Keep your diabetes under control.  Finish all prescribed medications.  Tell your sex partner that you have a yeast infection so he or she can be treated if necessary. SEEK MEDICAL CARE IF:   Your symptoms do not clear up or worsen in one week after treatment.  You have an oral temperature above 102 F (38.9 C).  You have trouble swallowing or eating for a prolonged time.  You develop blisters on and around your vagina.  You  develop vaginal bleeding and it is not your menstrual period.  You develop abdominal pain.  You develop intestinal problems as mentioned above.  You get weak or light-headed.  You have painful or increased urination.  You have pain during sexual intercourse. MAKE SURE YOU:   Understand these instructions.  Will watch your condition.  Will get help right away if you are not doing well or get worse. Document Released: 11/15/2004 Document Revised: 02/22/2014 Document Reviewed: 02/27/2010 [facility] Patient Information 2015 [place], [place]. This information is not intended to replace advice given to you by your health care provider. Make sure you discuss any questions you have with your health care provider.  Hemorrhoids Hemorrhoids are swollen veins around the rectum or anus. There are two types of hemorrhoids:   Internal hemorrhoids. These occur in the veins just inside the rectum. They may poke through to the outside and become irritated and painful.  External hemorrhoids. These occur in the veins outside the anus and can be felt as a painful swelling or hard lump near the anus. CAUSES  Pregnancy.   Obesity.   Constipation or diarrhea.   Straining to have a bowel movement.   Sitting for long periods on the toilet.  Heavy lifting or other activity that caused you to strain.  Anal intercourse. SYMPTOMS  Pain.   Anal itching or irritation.   Rectal bleeding.   Fecal leakage.   Anal swelling.   One or more lumps around the anus.  DIAGNOSIS  Your caregiver may be able to diagnose hemorrhoids by visual examination. Other examinations or tests that may be performed include:   Examination of the rectal area with a gloved hand (digital rectal exam).   Examination of anal canal using a small tube (scope).   A blood test if you have lost a significant amount of blood.  A test to look inside the colon (sigmoidoscopy or colonoscopy). TREATMENT Most  hemorrhoids can be treated at home. However, if symptoms do not seem to be getting better or if you have a lot of rectal bleeding, your caregiver may perform a procedure to help make the hemorrhoids get smaller or remove them completely. Possible treatments include:   Placing a rubber band at the base of the hemorrhoid to cut off the circulation (rubber band ligation).   Injecting a chemical to shrink the hemorrhoid (sclerotherapy).   Using a tool to burn the hemorrhoid (infrared light therapy).   Surgically removing the hemorrhoid (hemorrhoidectomy).   Stapling the hemorrhoid to block blood flow to the tissue (hemorrhoid stapling).  HOME CARE INSTRUCTIONS   Eat foods with fiber, such as whole grains, beans, nuts, fruits, and vegetables. Ask your doctor about taking products with added fiber in them (fibersupplements).  Increase fluid intake. Drink enough water and fluids to keep your urine clear or pale yellow.   Exercise regularly.   Go to the bathroom when you have the urge to have a bowel movement. Do not wait.   Avoid straining to have bowel movements.   Keep the anal area dry and clean. Use wet toilet paper or moist towelettes after a bowel movement.   Medicated creams and suppositories may be used or applied as directed.   Only take over-the-counter or prescription medicines as directed by your caregiver.   Take warm sitz baths for 15-20 minutes, 3-4 times a day to ease pain and discomfort.   Place ice packs on the hemorrhoids if they are tender and swollen. Using ice packs between sitz baths may be helpful.   Put ice in a plastic bag.   Place a towel between your skin and the bag.   Leave the ice on for 15-20 minutes, 3-4 times a day.   Do not use a donut-shaped pillow or sit on the toilet for long periods. This increases blood pooling and pain.  SEEK MEDICAL CARE IF:  You have increasing pain and swelling that is not controlled by treatment or  medicine.  You have uncontrolled bleeding.  You have difficulty or you are unable to have a bowel movement.  You have pain or inflammation outside the area of the hemorrhoids. MAKE SURE YOU:  Understand these instructions.  Will watch your condition.  Will get help right away if you are not doing well or get worse. Document Released: 10/05/2000 Document Revised: 09/24/2012 Document Reviewed: 08/12/2012 [facility] Patient Information 2015 [place], [place]. This information is not intended to replace advice given to you by your health care provider. Make sure you discuss any questions you have with your health care provider.   Anal Fissure, Adult An anal fissure is a small tear or crack in the skin around the anus. Bleeding from a fissure usually stops on its own within a few minutes. However, bleeding will often reoccur with each bowel movement until the crack heals.  CAUSES   Passing large, hard stools.  Frequent diarrheal stools.  Constipation.  Inflammatory bowel disease (Crohn's disease or ulcerative colitis).  Infections.  Anal sex. SYMPTOMS   Small amounts of blood seen on your stools, on toilet paper, or in the toilet after a bowel movement.  Rectal bleeding.  Painful bowel movements.  Itching or irritation around the anus. DIAGNOSIS Your caregiver will examine the anal area. An anal fissure can usually be seen with careful inspection. A rectal exam may be performed and a short tube (anoscope) may be used to examine the anal canal. TREATMENT   You may be instructed to take fiber supplements. These supplements can soften your stool to help make bowel movements easier.  Sitz baths may be recommended to help heal the tear. Do not use soap in the sitz baths.  A medicated cream or ointment may be prescribed to lessen discomfort. HOME CARE INSTRUCTIONS   Maintain a diet high in fruits, whole grains, and vegetables. Avoid constipating foods like bananas and dairy  products.  Take sitz baths as directed by your caregiver.  Drink enough fluids to keep your urine clear or pale yellow.  Only take over-the-counter or prescription medicines for pain, discomfort, or fever as directed by your caregiver. Do not take aspirin as this may increase bleeding.  Do not use ointments containing numbing medications (anesthetics) or hydrocortisone. They could slow healing. SEEK MEDICAL CARE IF:   Your fissure is not completely healed within 3 days.  You have further bleeding.  You have a fever.  You have diarrhea mixed with blood.  You have pain.  Your problem is getting worse rather than better. MAKE SURE YOU:   Understand these instructions.  Will watch your condition.  Will get help right away if you are not doing well or get worse. Document Released: 10/08/2005 Document Revised: 12/31/2011 Document Reviewed: 03/25/2011 [facility] Patient Information 2015 [place], [place]. This information is not intended to replace advice given to you by your health care provider. Make sure you discuss any questions you have with your health care provider.   Psoriasis Psoriasis is a common, long-lasting (chronic) inflammation of the skin. It affects both men and women equally, of all ages and all races. Psoriasis cannot be passed from person to person (not contagious). Psoriasis varies from mild to very severe. When severe, it can greatly affect your quality of life. Psoriasis is an inflammatory disorder affecting the skin as well as other organs including the joints (causing an arthritis). With psoriasis, the skin sheds its top layer of cells more rapidly than it does in someone without psoriasis. CAUSES  The cause of psoriasis is largely unknown. Genetics, your immune system, and the environment seem to play a role in causing psoriasis. Factors that can make psoriasis worse include:  Damage or trauma to the skin, such as cuts, scrapes, and sunburn. This damage often  causes new areas of psoriasis (lesions).  Winter dryness and lack of sunlight.  Medicines such as lithium, beta-blockers, antimalarial drugs, ACE inhibitors, nonsteroidal anti-inflammatory drugs (ibuprofen, aspirin), and terbinafine. Let your caregiver know if you are taking any of these drugs.  Alcohol. Excessive alcohol use should be avoided if you have psoriasis. Drinking large amounts of alcohol can affect:  How well your psoriasis treatment works.  How safe your psoriasis treatment is.  Smoking. If you smoke, ask your caregiver for help to quit.  Stress.  Bacterial or viral infections.  Arthritis. Arthritis associated with psoriasis (psoriatic arthritis) affects less  than 10% of patients with psoriasis. The arthritic intensity does not always match the skin psoriasis intensity. It is important to let your caregiver know if your joints hurt or if they are stiff. SYMPTOMS  The most common form of psoriasis begins with little red bumps that gradually become larger. The bumps begin to form scales that flake off easily. The lower layers of scales stick together. When these scales are scratched or removed, the underlying skin is tender and bleeds easily. These areas then grow in size and may become large. Psoriasis often creates a rash that looks the same on both sides of the body (symmetrical). It often affects the elbows, knees, groin, genitals, arms, legs, scalp, and nails. Affected nails often have pitting, loosen, thicken, crumble, and are difficult to treat.  "Inverse psoriasis"occurs in the armpits, under breasts, in skin folds, and around the groin, buttocks, and genitals.  "Guttate psoriasis" generally occurs in children and young adults following a recent sore throat (strep throat). It begins with many small, red, scaly spots on the skin. It clears spontaneously in weeks or a few months without treatment. DIAGNOSIS  Psoriasis is diagnosed by physical exam. A tissue sample (biopsy)  may also be taken. TREATMENT The treatment of psoriasis depends on your age, health, and living conditions.  Steroid (cortisone) creams, lotions, and ointments may be used. These treatments are associated with thinning of the skin, blood vessels that get larger (dilated), loss of skin pigmentation, and easy bruising. It is important to use these steroids as directed by your caregiver. Only treat the affected areas and not the normal, unaffected skin. People on long-term steroid treatment should wear a medical alert bracelet. Injections may be used in areas that are difficult to treat.  Scalp treatments are available as shampoos, solutions, sprays, foams, and oils. Avoid scratching the scalp and picking at the scales.  Anthralin medicine works well on areas that are difficult to treat. However, it stains clothes and skin and may cause temporary irritation.  Synthetic vitamin D (calcipotriene)can be used on small areas. It is available by prescription. The forms of synthetic vitamin D available in health food stores do not help with psoriasis.  Coal tarsare available in various strengths for psoriasis that is difficult to treat. They are one of the longest used treatments for difficult to treat psoriasis. However, they are messy to use.  Light therapy (UV therapy) can be carefully and professionally monitored in a dermatologist's office. Careful sunbathing is helpful for many people as directed by your caregiver. The exposure should be just long enough to cause a mild redness (erythema) of your skin. Avoid sunburn as this may make the condition worse. Sunscreen (SPF of 30 or higher) should be used to protect against sunburn. Cataracts, wrinkles, and skin aging are some of the harmful side effects of light therapy.  If creams (topical medicines) fail, there are several other options for systemic or oral medicines your caregiver can suggest. Psoriasis can sometimes be very difficult to treat. It can  come and go. It is necessary to follow up with your caregiver regularly if your psoriasis is difficult to treat. Usually, with persistence you can get a good amount of relief. Maintaining consistent care is important. Do not change caregivers just because you do not see immediate results. It may take several trials to find the right combination of treatment for you. PREVENTING FLARE-UPS  Wear gloves while you wash dishes, while cleaning, and when you are outside in the cold.  If you have radiators, place a bowl of water or damp towel on the radiator. This will help put water back in the air. You can also use a humidifier to keep the air moist. Try to keep the humidity at about 60% in your home.  Apply moisturizer while your skin is still damp from bathing or showering. This traps water in the skin.  Avoid long, hot baths or showers. Keep soap use to a minimum. Soaps dry out the skin and wash away the protective oils. Use a fragrance free, dye free soap.  Drink enough water and fluids to keep your urine clear or pale yellow. Not drinking enough water depletes your skin's water supply.  Turn off the heat at night and keep it low during the day. Cool air is less drying. SEEK MEDICAL CARE IF:  You have increasing pain in the affected areas.  You have uncontrolled bleeding in the affected areas.  You have increasing redness or warmth in the affected areas.  You start to have pain or stiffness in your joints.  You start feeling depressed about your condition.  You have a fever. Document Released: 10/05/2000 Document Revised: 12/31/2011 Document Reviewed: 04/02/2011 [facility] Patient Information 2015 [place], [place]. This information is not intended to replace advice given to you by your health care provider. Make sure you discuss any questions you have with your health care provider.

## 2014-12-16 ENCOUNTER — Telehealth: Payer: Self-pay | Admitting: Internal Medicine

## 2014-12-16 NOTE — Telephone Encounter (Signed)
Pt states he went to the [facility] on 2/20 for hemorrhoids and wants a referral to [facility] with Dr. Ronald Lobo

## 2014-12-16 NOTE — Telephone Encounter (Signed)
Pt coming in tomorrow

## 2014-12-16 NOTE — Telephone Encounter (Signed)
Make a follow-up appointment here. I reviewed the urgent care notes and based on their findings needs recheck before we can decide on a referral. They actually didn't comment on hemorrhoids because there was other rash and findings that were very tender and not specifically hemorrhoids. Follow-up appointment please and then we can decide on referral

## 2014-12-17 ENCOUNTER — Telehealth: Payer: Self-pay | Admitting: Medical

## 2014-12-17 ENCOUNTER — Ambulatory Visit (INDEPENDENT_AMBULATORY_CARE_PROVIDER_SITE_OTHER): Payer: 59 | Admitting: Medical

## 2014-12-17 ENCOUNTER — Encounter: Payer: Self-pay | Admitting: Medical

## 2014-12-17 VITALS — BP 140/80 | HR 81 | Temp 98.1°F | Resp 15 | Wt 222.0 lb

## 2014-12-17 DIAGNOSIS — R21 Rash and other nonspecific skin eruption: Secondary | ICD-10-CM

## 2014-12-17 DIAGNOSIS — K644 Residual hemorrhoidal skin tags: Secondary | ICD-10-CM

## 2014-12-17 DIAGNOSIS — K648 Other hemorrhoids: Secondary | ICD-10-CM

## 2014-12-17 DIAGNOSIS — K602 Anal fissure, unspecified: Secondary | ICD-10-CM

## 2014-12-17 DIAGNOSIS — B3789 Other sites of candidiasis: Secondary | ICD-10-CM

## 2014-12-17 MED ORDER — DILTIAZEM GEL 2 %: 1.0000 "application " | Freq: Three times a day (TID) | CUTANEOUS | Status: DC | PRN

## 2014-12-17 MED ORDER — CLOTRIMAZOLE-BETAMETHASONE 1-0.05 % EX CREA: 1.0000 "application " | TOPICAL_CREAM | Freq: Two times a day (BID) | CUTANEOUS | Status: DC

## 2014-12-17 MED ORDER — HYDROCORTISONE ACETATE 25 MG RE SUPP: 25.0000 mg | Freq: Two times a day (BID) | RECTAL | Status: DC

## 2014-12-17 MED ORDER — TRAMADOL HCL 50 MG PO TABS: 50.0000 mg | ORAL_TABLET | Freq: Three times a day (TID) | ORAL | Status: DC | PRN

## 2014-12-17 MED ORDER — LIDOCAINE (ANORECTAL) 5 % EX GEL: CUTANEOUS | Status: DC

## 2014-12-17 MED ORDER — IBUPROFEN 600 MG PO TABS: 600.0000 mg | ORAL_TABLET | Freq: Three times a day (TID) | ORAL | Status: DC | PRN

## 2014-12-17 NOTE — Patient Instructions (Addendum)
Encounter Diagnoses  Name Primary?  . External hemorrhoid Yes  . Anal fissure   . Perianal rash    Recommendations  Continue over-the-counter stool softeners for the next several days until things get to normal  Use hot soapy baths, Epsom salts in the baths, and soak for 20 minutes twice daily  Drink plenty of water  For pain, use ibuprofen 600 mg 3 times daily  For worse pain you can Ultram 1 tablet up to every 6-8 hours  Before bowel movements clean the area with baby wipes or Tuxs pads, and then use the diltiazem gel and lidocaine gel separately  Continue the Lotrisone cream twice daily  For additional improvement, use the suppository today and sparingly as needed to see if this provides additional relief over the next few days  Follow-up in 1-2 weeks

## 2014-12-17 NOTE — Telephone Encounter (Signed)
shane spoke with pharmacist instead

## 2014-12-17 NOTE — Progress Notes (Signed)
Subjective: 3 weeks ago started with painful BMs.   Was straining more than usual.   Was helping a friend move a piano with dolleys.  That morning on the ride to [facility] couldn't get comfortable, had to sit with leg cocked up.  At the time felt a small bump at the anus.  Was having some bright red blood after done stooling, on toilet paper was bright red.   Since then he has continued to have excruciating pain with BM.  Once he gets past the initial start of the BM, ok, but then 45 min later has discomfort again.  Feels something hard in the anus.  continues to have bright red blood at end of wiping, 1 of every few BMs.  Really nerouvs about BMs given the pain.  He also recently had a bright red rash and 2 very painful BMs last week, so he went to urgent care last Saturday was given antifungal and topical cream.  Been overworked , had 3 coworkers quit without notice the first of the year.  Works coin operated machines.   Been using tux pads, recti care cream.    He is under a little stress as his baby boy Sydnee Cabal was born yesterday.  Both father and cousin have had hemorrhoids, no hx/o IBD in family.   No other aggravating or relieving factors. No other complaint.  Past Medical History  Diagnosis Date  . Bee sting-induced anaphylaxis 2013  . Laceration 2009    right hand  . Seizure 2009    questionable seizure, syncope, one episode, none since - seen at [facility] ED  . Hyperlipidemia 6/15  . Hypertension 6/15  . Tobacco use   . Allergy    Family History  Problem Relation Age of Onset  . Hypertension Father   . Hypertension Paternal Uncle   . Stroke Paternal Uncle    ROS as in subjective  Objective: BP 140/80 mmHg  Pulse 81  Temp(Src) 98.1 F (36.7 C) (Oral)  Resp 15  Wt 222 lb (100.699 kg)  Gen: wd, wn, nad There is a contiguous area of pink red coloration from his scrotum to his anus, there are several small patches of erythema in the perianal area, there are small anal fissures  there is a small external hemorrhoid that is non thrombosed but somewhat tender, no obvious fluctuation induration or apparent abscess.   Testicles nontender no mass no swelling GU otherwise unremarkable, circumcised   Assessment: Encounter Diagnoses  Name Primary?  . External hemorrhoid Yes  . Anal fissure   . Perianal rash   . Perianal candidiasis    Plan: I reviewed the recent urgent care notes, discussed his symptoms and concerns. I added ibuprofen and when necessary Ultram for pain. Added Proctosol suppository to see if this can help additionally.  Otherwise continue what he is already doing including diltiazem and lidocaine gel before bowel movements, stool softener over-the-counter twice daily, good hydration, tux pads or baby wipes, sits baths, Lotrisone cream twice daily follow-up in one week  And graduated him on the birth of his recent son  Patient Instructions   Encounter Diagnoses  Name Primary?  . External hemorrhoid Yes  . Anal fissure   . Perianal rash    Recommendations  Continue over-the-counter stool softeners for the next several days until things get to normal  Use hot soapy baths, Epsom salts in the baths, and soak for 20 minutes twice daily  Drink plenty of water  For pain, use ibuprofen  600 mg 3 times daily  For worse pain you can Ultram 1 tablet up to every 6-8 hours  Before bowel movements clean the area with baby wipes or Tuxs pads, and then use the diltiazem gel and lidocaine gel separately  Continue the Lotrisone cream twice daily  For additional improvement, use the suppository today and sparingly as needed to see if this provides additional relief over the next few days  Follow-up in 1-2 weeks

## 2014-12-17 NOTE — Telephone Encounter (Signed)
[facility] called stating that they do not have Diltiazem listed as coming in a gel form in their system so they can not dispense Diltiazem 2% gel. Pharmacy requesting a call back.

## 2014-12-17 NOTE — Telephone Encounter (Signed)
I had already agreed to the ointment instead of the gel, I thought Mickel Baas or Gabriel Cirri or someone up front called this in.

## 2014-12-21 ENCOUNTER — Encounter: Payer: Self-pay | Admitting: Internal Medicine

## 2015-02-16 ENCOUNTER — Other Ambulatory Visit: Payer: Self-pay | Admitting: Family Medicine

## 2015-02-16 ENCOUNTER — Telehealth: Payer: Self-pay | Admitting: Medical

## 2015-02-16 MED ORDER — ATORVASTATIN CALCIUM 40 MG PO TABS: 40.0000 mg | ORAL_TABLET | Freq: Every day | ORAL | Status: DC

## 2015-02-16 MED ORDER — LISINOPRIL 20 MG PO TABS
20.0000 mg | ORAL_TABLET | Freq: Every day | ORAL | Status: DC
Start: 2015-02-16 — End: 2015-02-21

## 2015-02-16 NOTE — Telephone Encounter (Signed)
Both medications was sent to the patients pharmacy

## 2015-02-16 NOTE — Telephone Encounter (Signed)
Pt called and scheduled an appt for Monday but is completely out of meds. Please refill lisinopril and Lipitor to [facility]. [facility]

## 2015-02-21 ENCOUNTER — Encounter: Payer: Self-pay | Admitting: Medical

## 2015-02-21 ENCOUNTER — Ambulatory Visit (INDEPENDENT_AMBULATORY_CARE_PROVIDER_SITE_OTHER): Payer: 59 | Admitting: Medical

## 2015-02-21 ENCOUNTER — Other Ambulatory Visit: Payer: Self-pay | Admitting: Medical

## 2015-02-21 VITALS — BP 150/80 | HR 77 | Resp 16 | Wt 222.0 lb

## 2015-02-21 DIAGNOSIS — E785 Hyperlipidemia, unspecified: Secondary | ICD-10-CM | POA: Diagnosis not present

## 2015-02-21 DIAGNOSIS — I10 Essential (primary) hypertension: Secondary | ICD-10-CM | POA: Diagnosis not present

## 2015-02-21 LAB — LIPID PANEL
CHOL/HDL RATIO: 3.6 ratio
Cholesterol: 208 mg/dL — ABNORMAL HIGH (ref 0–200)
HDL: 57 mg/dL (ref >=40)
LDL Cholesterol: 125 mg/dL — ABNORMAL HIGH (ref 0–99)
Triglycerides: 129 mg/dL (ref <150)
VLDL: 26 mg/dL (ref 0–40)

## 2015-02-21 MED ORDER — LISINOPRIL 20 MG PO TABS: 20.0000 mg | ORAL_TABLET | Freq: Every day | ORAL | Status: DC

## 2015-02-21 MED ORDER — ATORVASTATIN CALCIUM 40 MG PO TABS: 40.0000 mg | ORAL_TABLET | Freq: Every day | ORAL | Status: DC

## 2015-02-21 MED ORDER — ATENOLOL 25 MG PO TABS: 25.0000 mg | ORAL_TABLET | Freq: Every day | ORAL | Status: DC

## 2015-02-21 NOTE — Progress Notes (Signed)
Subjective: Here for med check  HTN - compliant with medication without c/o Lisinopril 20mg  daily, not checking BP regularly  Hyperlipidemia - doing fine on medication.  Trying to eat health.  Still on the go often.    Exercising.   His baby boy is 60mo old doing well.  No other aggravating or relieving factors. No other complaint.  ROS as in subjective  Past Medical History  Diagnosis Date  . Bee sting-induced anaphylaxis 2013  . Laceration 2009    right hand  . Seizure 2009    questionable seizure, syncope, one episode, none since - seen at [facility] ED  . Hyperlipidemia 6/15  . Hypertension 6/15  . Tobacco use   . Allergy      Objective: BP 150/80 mmHg  Pulse 77  Resp 16  Wt 222 lb (100.699 kg)  General appearance: alert, no distress, WD/WN Neck: supple, no lymphadenopathy, no thyromegaly, no masses Heart: RRR, normal S1, S2, no murmurs Lungs: CTA bilaterally, no wheezes, rhonchi, or rales Abdomen: +bs, soft, non tender, non distended, no masses, no hepatomegaly, no splenomegaly Pulses: 2+ symmetric, upper and lower extremities, normal cap refill Ext: no edema    Assessment: Encounter Diagnoses  Name Primary?  . Essential hypertension Yes  . Hyperlipidemia      Plan: C/t current medication, but add Atenolol 25mg  daily.  discussed risks/benefits of medication.  F/u pending labs.

## 2015-02-22 LAB — COMPREHENSIVE METABOLIC PANEL
ALT: 36 U/L (ref 0–53)
AST: 26 U/L (ref 0–37)
Albumin: 4.8 g/dL (ref 3.5–5.2)
Alkaline Phosphatase: 50 U/L (ref 39–117)
BILIRUBIN TOTAL: 1.1 mg/dL (ref 0.2–1.2)
BUN: 8 mg/dL (ref 6–23)
CHLORIDE: 103 meq/L (ref 96–112)
CO2: 23 mEq/L (ref 19–32)
CREATININE: 0.7 mg/dL (ref 0.50–1.35)
Calcium: 9.5 mg/dL (ref 8.4–10.5)
Glucose, Bld: 88 mg/dL (ref 70–99)
POTASSIUM: 3.9 meq/L (ref 3.5–5.3)
Sodium: 139 mEq/L (ref 135–145)
Total Protein: 7.2 g/dL (ref 6.0–8.3)

## 2015-02-23 ENCOUNTER — Other Ambulatory Visit: Payer: Self-pay | Admitting: Medical

## 2015-02-23 MED ORDER — ROSUVASTATIN CALCIUM 40 MG PO TABS
40.0000 mg | ORAL_TABLET | Freq: Every day | ORAL | Status: DC
Start: 2015-02-23 — End: 2016-07-17

## 2015-03-23 ENCOUNTER — Telehealth: Payer: Self-pay | Admitting: Medical

## 2015-03-23 NOTE — Telephone Encounter (Signed)
Pt needs refill of epi pen sent to [facility]

## 2015-03-24 ENCOUNTER — Other Ambulatory Visit: Payer: Self-pay | Admitting: Medical

## 2015-03-24 MED ORDER — EPINEPHRINE 0.3 MG/0.3ML IJ SOAJ: 0.3000 mg | Freq: Once | INTRAMUSCULAR | Status: DC | PRN

## 2015-03-24 NOTE — Telephone Encounter (Signed)
I called out his Epipen to his pharmacy per Nyra Jabs PA

## 2015-03-24 NOTE — Telephone Encounter (Signed)
Call out Epipen.  For some reason this doesn't E prescribe.

## 2015-03-29 ENCOUNTER — Other Ambulatory Visit: Payer: Self-pay | Admitting: Medical

## 2015-10-23 HISTORY — PX: COLONOSCOPY: SHX174

## 2015-11-21 ENCOUNTER — Ambulatory Visit (INDEPENDENT_AMBULATORY_CARE_PROVIDER_SITE_OTHER): Payer: 59 | Admitting: Medical

## 2015-11-21 ENCOUNTER — Encounter: Payer: Self-pay | Admitting: Medical

## 2015-11-21 VITALS — BP 116/82 | HR 97 | Wt 229.0 lb

## 2015-11-21 DIAGNOSIS — H65191 Other acute nonsuppurative otitis media, right ear: Secondary | ICD-10-CM

## 2015-11-21 MED ORDER — AMOXICILLIN 500 MG PO TABS: ORAL_TABLET | ORAL | Status: DC

## 2015-11-21 NOTE — Progress Notes (Signed)
Subjective: Chief Complaint  Patient presents with  . ear problems    rt ear he can not hear out of. has had a persistent cough. no appetite. achy. low energy.    Here for illness.  I saw his wife fairly recently for illness.  He also notes that his 29mo son Geryl Rankins is in daycare now and is currently on treatment for ear infection. He reports right ear feeling stopped up and muffled x 5 days, had 1 day of ear pain.  Has had some cough for about a week.  Ear feels like he is wearing hearing protection on the right.  Appetite is down, taste is off, has some aches, but not chills.  Denies fever.   Working for [employer], Dentist between Manufacturing engineer.  No other aggravating or relieving factors. No other complaint.  Past Medical History  Diagnosis Date  . Bee sting-induced anaphylaxis 2013  . Laceration 2009    right hand  . Seizure ([place]) 2009    questionable seizure, syncope, one episode, none since - seen at [facility] ED  . Hyperlipidemia 6/15  . Hypertension 6/15  . Tobacco use   . Allergy    ROS as in subjective   Objective: BP 116/82 mmHg  Pulse 97  Wt 229 lb (103.874 kg)  SpO2 98%  General appearance: alert, no distress, WD/WN, mildly ill appearing HEENT: normocephalic, sclerae anicteric, conjunctiva pink and moist, left TM normal appearing, right TM bulging and with moderate erythema, nares patent, no discharge or erythema, pharynx normal, tonsils unremarkable Oral cavity: MMM, no lesions Neck: supple, no lymphadenopathy, no thyromegaly, no masses Lungs: CTA bilaterally, no wheezes, rhonchi, or rales     Assessment: Encounter Diagnosis  Name Primary?  . Acute nonsuppurative otitis media of right ear Yes    Plan: Discussed exam findings, diagnosis, treatment recommendations.  F/u if not resolved within 10 days  Jaysiah was seen today for ear problems.  Diagnoses and all orders for this visit:  Acute nonsuppurative otitis media of right ear  Other orders -      amoxicillin (AMOXIL) 500 MG tablet; 2 tablets po BID x 10 days

## 2015-12-16 ENCOUNTER — Encounter: Payer: 59 | Admitting: Medical

## 2015-12-27 ENCOUNTER — Encounter: Payer: 59 | Admitting: Medical

## 2016-01-10 ENCOUNTER — Encounter: Payer: 59 | Admitting: Medical

## 2016-06-07 ENCOUNTER — Encounter: Payer: 59 | Admitting: Medical

## 2016-07-16 ENCOUNTER — Encounter: Payer: Self-pay | Admitting: Medical

## 2016-07-16 ENCOUNTER — Ambulatory Visit (INDEPENDENT_AMBULATORY_CARE_PROVIDER_SITE_OTHER): Payer: 59 | Admitting: Medical

## 2016-07-16 VITALS — BP 136/82 | HR 85 | Temp 98.3°F | Ht 76.0 in | Wt 233.8 lb

## 2016-07-16 DIAGNOSIS — Z720 Tobacco use: Secondary | ICD-10-CM | POA: Diagnosis not present

## 2016-07-16 DIAGNOSIS — L409 Psoriasis, unspecified: Secondary | ICD-10-CM | POA: Diagnosis not present

## 2016-07-16 DIAGNOSIS — Z0000 Encounter for general adult medical examination without abnormal findings: Secondary | ICD-10-CM | POA: Insufficient documentation

## 2016-07-16 DIAGNOSIS — Z7185 Encounter for immunization safety counseling: Secondary | ICD-10-CM

## 2016-07-16 DIAGNOSIS — Z2821 Immunization not carried out because of patient refusal: Secondary | ICD-10-CM | POA: Diagnosis not present

## 2016-07-16 DIAGNOSIS — Z7189 Other specified counseling: Secondary | ICD-10-CM

## 2016-07-16 DIAGNOSIS — I10 Essential (primary) hypertension: Secondary | ICD-10-CM

## 2016-07-16 DIAGNOSIS — M25562 Pain in left knee: Secondary | ICD-10-CM | POA: Insufficient documentation

## 2016-07-16 DIAGNOSIS — E785 Hyperlipidemia, unspecified: Secondary | ICD-10-CM

## 2016-07-16 DIAGNOSIS — M9420 Chondromalacia, unspecified site: Secondary | ICD-10-CM | POA: Diagnosis not present

## 2016-07-16 DIAGNOSIS — F17200 Nicotine dependence, unspecified, uncomplicated: Secondary | ICD-10-CM

## 2016-07-16 LAB — COMPREHENSIVE METABOLIC PANEL
ALK PHOS: 56 U/L (ref 40–115)
ALT: 119 U/L — AB (ref 9–46)
AST: 108 U/L — ABNORMAL HIGH (ref 10–40)
Albumin: 4.8 g/dL (ref 3.6–5.1)
BUN: 9 mg/dL (ref 7–25)
CHLORIDE: 100 mmol/L (ref 98–110)
CO2: 27 mmol/L (ref 20–31)
Calcium: 9.8 mg/dL (ref 8.6–10.3)
Creat: 0.8 mg/dL (ref 0.60–1.35)
Glucose, Bld: 99 mg/dL (ref 65–99)
Potassium: 4.3 mmol/L (ref 3.5–5.3)
Sodium: 135 mmol/L (ref 135–146)
Total Bilirubin: 2 mg/dL — ABNORMAL HIGH (ref 0.2–1.2)
Total Protein: 7.3 g/dL (ref 6.1–8.1)

## 2016-07-16 LAB — POC URINALSYSI DIPSTICK (AUTOMATED)
BILIRUBIN UA: NEGATIVE
Blood, UA: NEGATIVE
Glucose, UA: NEGATIVE
KETONES UA: NEGATIVE
LEUKOCYTES UA: NEGATIVE
Nitrite, UA: NEGATIVE
PH UA: 5.5
PROTEIN UA: NEGATIVE
Urobilinogen, UA: 0.2

## 2016-07-16 LAB — LIPID PANEL
Cholesterol: 266 mg/dL — ABNORMAL HIGH (ref 125–200)
HDL: 66 mg/dL (ref >=40)
LDL Cholesterol: 170 mg/dL — ABNORMAL HIGH (ref <130)
Total CHOL/HDL Ratio: 4 Ratio (ref <=5.0)
Triglycerides: 150 mg/dL — ABNORMAL HIGH (ref <150)
VLDL: 30 mg/dL (ref <30)

## 2016-07-16 LAB — CBC
HCT: 47.4 % (ref 38.5–50.0)
Hemoglobin: 16.7 g/dL (ref 13.2–17.1)
MCH: 33.2 pg — ABNORMAL HIGH (ref 27.0–33.0)
MCHC: 35.2 g/dL (ref 32.0–36.0)
MCV: 94.2 fL (ref 80.0–100.0)
MPV: 9.1 fL (ref 7.5–12.5)
Platelets: 207 10*3/uL (ref 140–400)
RBC: 5.03 MIL/uL (ref 4.20–5.80)
RDW: 12.6 % (ref 11.0–15.0)
WBC: 6.6 10*3/uL (ref 4.0–10.5)

## 2016-07-16 MED ORDER — ATENOLOL 25 MG PO TABS: 25.0000 mg | ORAL_TABLET | Freq: Every day | ORAL | 3 refills | Status: DC

## 2016-07-16 MED ORDER — EPINEPHRINE 0.3 MG/0.3ML IJ SOAJ: 0.3000 mg | Freq: Once | INTRAMUSCULAR | 1 refills | Status: DC | PRN

## 2016-07-16 NOTE — Progress Notes (Signed)
Subjective:   HPI  Wesley Cruz is a 35 y.o. male who presents for a complete physical.  Medical team: Dr. Ysidro Evert, psychiatry Ruthel Martine Audelia Acton, PA-C here for primary care  Concerns: Saw his counselor recently regarding stress management.    BPs there were good.   Quit BP medication and lipid medication 6 months ago he thinks.  BPs were running fine.  Was having some cough with Lisinopril.    He also decided to not take statin.  Diet has improved since last visit here.  Overall handling stress ok.   Has one child and is expecting again this January.   Using lotion for psoriasis, no other treatment. Saw Dr. Allyson Sabal within past 2 years for this.  Knee pain - left x 4 years.   Saw ortho in the past after we got MRI.  He has continued the exercises they recommended, however he still reports left knee pain, some swelling, hurts with waling.   Reviewed their medical, surgical, family, social, medication, and allergy history and updated chart as appropriate.  Past Medical History:  Diagnosis Date  . Allergy   . Anxiety   . Bee sting-induced anaphylaxis 2013  . Hyperlipidemia 6/15  . Hypertension 6/15  . Laceration 2009   right hand  . Seizure ([place]) 2009   questionable seizure, syncope, one episode, none since - seen at [facility] ED  . Tobacco use     Past Surgical History:  Procedure Laterality Date  . LACERATION REPAIR  2009   right dorsal hand    Social History   Social History  . Marital status: Married    Spouse name: N/A  . Number of children: N/A  . Years of education: N/A   Occupational History  . Not on file.   Social History Main Topics  . Smoking status: Current Every Day Smoker    Packs/day: 0.50    Years: 12.00    Types: Cigarettes  . Smokeless tobacco: Not on file  . Alcohol use 18.0 oz/week    30 Cans of beer per week  . Drug use: No  . Sexual activity: Not on file   Other Topics Concern  . Not on file   Social History Narrative   Married, has  dogs, 1 child, 1 on the way, exercise - walking.  Christian.  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 Hypertension Father   . Hypertension Paternal Uncle   . Stroke Paternal Uncle      Current Outpatient Prescriptions:  .  amphetamine-dextroamphetamine (ADDERALL XR) 15 MG 24 hr capsule, Take 15 mg by mouth every morning., Disp: , Rfl:  .  ibuprofen (ADVIL) 200 MG tablet, Take 800 mg by mouth every 6 (six) hours as needed for moderate pain. Reported on 11/21/2015, Disp: , Rfl:  .  atenolol (TENORMIN) 25 MG tablet, Take 1 tablet (25 mg total) by mouth daily. (Patient not taking: Reported on 07/16/2016), Disp: 90 tablet, Rfl: 0 .  EPINEPHrine (EPIPEN 2-PAK) 0.3 mg/0.3 mL IJ SOAJ injection, Inject 0.3 mLs (0.3 mg total) into the muscle once as needed (for severe allergic reaction). CAll 911 immediately if you have to use this medicine (Patient not taking: Reported on 07/16/2016), Disp: 1 Device, Rfl: 0 .  lisinopril (PRINIVIL,ZESTRIL) 20 MG tablet, Take 1 tablet (20 mg total) by mouth daily. (Patient not taking: Reported on 07/16/2016), Disp: 90 tablet, Rfl: 3 .  rosuvastatin (CRESTOR) 40 MG tablet,  Take 1 tablet (40 mg total) by mouth daily. (Patient not taking: Reported on 07/16/2016), Disp: 90 tablet, Rfl: 0  Allergies  Allergen Reactions  . Hornet Venom Anaphylaxis    Japanese hornet     Review of Systems Constitutional: -fever, -chills, -sweats, -unexpected weight change, -decreased appetite, -fatigue Allergy: -sneezing, -itching, -congestion Dermatology: -changing moles, --rash, -lumps, +skin issues ENT: -runny nose, -ear pain, -sore throat, -hoarseness, -sinus pain, -teeth pain, - ringing in ears, -hearing loss, -nosebleeds Cardiology: -chest pain, -palpitations, -swelling, -difficulty breathing when lying flat, -waking up short of breath Respiratory: -cough, -shortness of breath, -difficulty breathing with exercise or exertion, -wheezing,  -coughing up blood Gastroenterology: -abdominal pain, -nausea, -vomiting, -diarrhea, -constipation, -blood in stool, -changes in bowel movement, -difficulty swallowing or eating Hematology: -bleeding, -bruising  Musculoskeletal: +joint aches, -muscle aches, +joint swelling, -back pain, -neck pain, -cramping, -changes in gait Ophthalmology: denies vision changes, eye redness, itching, discharge Urology: -burning with urination, -difficulty urinating, -blood in urine, -urinary frequency, -urgency, -incontinence Neurology: -headache, -weakness, -tingling, -numbness, -memory loss, -falls, -dizziness Psychology: -depressed mood, -agitation, -sleep problems, +stress     Objective:   Physical Exam  BP 136/82 (BP Location: Left Arm, Patient Position: Sitting, Cuff Size: Large)   Pulse 85   Temp 98.3 F (36.8 C) (Oral)   Ht 6\' 4"  (1.93 m)   Wt 233 lb 12.8 oz (106.1 kg)   SpO2 98%   BMI 28.46 kg/m   General appearance: alert, no distress, WD/WN, white male Skin: right dorsal medial hand with surgical scar, scattered scaly oval patches of psoriasis on right anterior knee, left axilla, right axilla, left chest, few scattered benign appearing lesions, left upper back with round raised red 63mm diameter lesion uniform suggestive of strawberry hemangioma, no other worrisome lesions HEENT: normocephalic, conjunctiva/corneas normal, sclerae anicteric, PERRLA, EOMi, nares patent, no discharge or erythema, pharynx normal Oral cavity: MMM, tongue normal, teeth in good repair Neck: supple, no lymphadenopathy, no thyromegaly, no masses, normal ROM, no bruits Chest: non tender, normal shape and expansion Heart: RRR, normal S1, S2, no murmurs Lungs: CTA bilaterally, no wheezes, rhonchi, or rales Abdomen: +bs, soft, non tender, non distended, no masses, no hepatomegaly, no splenomegaly, no bruits Back: non tender, normal ROM, no scoliosis Musculoskeletal: +pain with left knee Mcmurray, laxity of MCL and pain  with manipulation, otherwise upper extremities non tender, no obvious deformity, normal ROM throughout, lower extremities non tender, no obvious deformity, normal ROM throughout Extremities: no edema, no cyanosis, no clubbing Pulses: 2+ symmetric, upper and lower extremities, normal cap refill Neurological: alert, oriented x 3, CN2-12 intact, strength normal upper extremities and lower extremities, sensation normal throughout, DTRs 2+ throughout, no cerebellar signs, gait normal Psychiatric: normal affect, behavior normal, pleasant  GU: normal male external genitalia, circumcised, nontender, no masses, no hernia, no lymphadenopathy Rectal: deferred   Adult ECG Report  Indication: hypertension, physical  Rate: 69 bpm   Rhythm: normal sinus rhythm  QRS Axis: 69 degrees  PR Interval: 161ms  QRS Duration: 35ms  QTc: 466ms  Conduction Disturbances: none  Other Abnormalities: none  Patient's cardiac risk factors are: hypertension and smoking/ tobacco exposure.  EKG comparison: none  Narrative Interpretation: normal EKG    Assessment and Plan :     Encounter Diagnoses  Name Primary?  . Encounter for health maintenance examination in adult Yes  . Hyperlipidemia   . Essential hypertension   . Smoker   . Vaccine counseling   . Influenza vaccination declined   . Left  knee pain   . Psoriasis   . Chondromalacia    Physical exam - discussed healthy lifestyle, diet, exercise, preventative care, vaccinations, and addressed their concerns.   Counseled again on tobacco, advised cessation Psoriasis - f/u pending labs, consider drug study or f/u with dermatology Knee pain - advised f/u with ortho, looked back over his knee MRI from this past year See your eye doctor yearly for routine vision care. See your dentist yearly for routine dental care including hygiene visits twice yearly. Hypertension - advised he monitor BPs at home and get me readings in 1 month.   He reports normal home  readings, he has done better with stress reduction, healthier diet. hyperlipidemia - discussed criteria for treatment, discussed diet, exercise, reasoning for medication.  Will likely recommend restarting statin. Follow-up pending studies

## 2016-07-17 ENCOUNTER — Telehealth: Payer: Self-pay

## 2016-07-17 ENCOUNTER — Other Ambulatory Visit: Payer: Self-pay | Admitting: Medical

## 2016-07-17 DIAGNOSIS — R748 Abnormal levels of other serum enzymes: Secondary | ICD-10-CM

## 2016-07-17 LAB — MICROALBUMIN / CREATININE URINE RATIO
CREATININE, URINE: 349 mg/dL (ref 20–370)
MICROALB/CREAT RATIO: 9 ug/mg{creat} (ref <30)
Microalb, Ur: 3.3 mg/dL

## 2016-07-17 LAB — VITAMIN D 25 HYDROXY (VIT D DEFICIENCY, FRACTURES): VIT D 25 HYDROXY: 25 ng/mL — AB (ref 30–100)

## 2016-07-17 MED ORDER — ROSUVASTATIN CALCIUM 40 MG PO TABS: 40.0000 mg | ORAL_TABLET | Freq: Every day | ORAL | 3 refills | Status: DC

## 2016-07-17 MED ORDER — VITAMIN D (ERGOCALCIFEROL) 1.25 MG (50000 UNIT) PO CAPS: 50000.0000 [IU] | ORAL_CAPSULE | ORAL | 3 refills | Status: DC

## 2016-07-17 MED ORDER — TRIAMCINOLONE ACETONIDE 0.1 % EX CREA: 1.0000 "application " | TOPICAL_CREAM | Freq: Two times a day (BID) | CUTANEOUS | 0 refills | Status: DC

## 2016-07-17 NOTE — Telephone Encounter (Signed)
Lab order in.  Regarding psoriasis, no study open currently.  Have him use Triamcinolone cream I sent for worse flare up.  Can use this for 1-2 weeks at a time.   Can use [employer] OTC, can use sun tan bed occasionally.

## 2016-07-17 NOTE — Telephone Encounter (Signed)
-----   Message from Carlena Hurl, PA-C sent at 07/17/2016  8:09 AM EDT ----- Labs show cholesterol high as expected, vit D was low, but unexpectedly liver tests were high.    Begin Vit D weekly, begin back on Crestor for cholesterol indefinitely.   Regarding liver, did he drink the night before yesterday?  Does he have any prior history of hepatitis exposure?   If he drank several alcoholic drinks the night before our visit, then I would recommend he cut way back on alcohol and recheck liver tests in 2 wk.   If he did not drink the night before, then next steps would be hepatitis lab panel and liver ultrasound.     Let me know before I order tests.

## 2016-07-17 NOTE — Telephone Encounter (Signed)
Called patient. Gave instructions. Patient verbalized understanding.  

## 2016-07-17 NOTE — Telephone Encounter (Signed)
Called patient. Gave instructions. Patient verbalized understanding. Scheduled lab visit in 4 weeks.

## 2016-07-17 NOTE — Telephone Encounter (Signed)
Have him cut back on alcohol in general, and lets repeat liver tests in 3-4 wk.  The liver tests were over 100, not just a little elevated.

## 2016-07-17 NOTE — Telephone Encounter (Signed)
Called patient. Gave lab results and instructions.  Patient verbalized understanding. Had 3 beers b/n 5:30 and 7:30 the evening before. He says he drinks 3-4 beers on week nights; more on weekends regularly.  Total consumption: 95% beer; 5% liquor Willing to cut back and have hepatic panel if needed. No prior history of hepatitis exposure.

## 2016-08-15 ENCOUNTER — Other Ambulatory Visit: Payer: Self-pay

## 2016-08-17 ENCOUNTER — Other Ambulatory Visit: Payer: 59

## 2016-08-17 ENCOUNTER — Other Ambulatory Visit: Payer: Self-pay | Admitting: Medical

## 2016-08-17 DIAGNOSIS — R748 Abnormal levels of other serum enzymes: Secondary | ICD-10-CM

## 2016-08-17 LAB — HEPATIC FUNCTION PANEL
ALK PHOS: 74 U/L (ref 40–115)
ALT: 189 U/L — AB (ref 9–46)
AST: 209 U/L — AB (ref 10–40)
Albumin: 4.8 g/dL (ref 3.6–5.1)
BILIRUBIN DIRECT: 0.3 mg/dL — AB (ref <=0.2)
BILIRUBIN TOTAL: 1.2 mg/dL (ref 0.2–1.2)
Indirect Bilirubin: 0.9 mg/dL (ref 0.2–1.2)
Total Protein: 7.4 g/dL (ref 6.1–8.1)

## 2016-08-20 ENCOUNTER — Other Ambulatory Visit: Payer: Self-pay | Admitting: Medical

## 2016-08-20 DIAGNOSIS — R748 Abnormal levels of other serum enzymes: Secondary | ICD-10-CM

## 2016-08-20 LAB — HEPATITIS PANEL, ACUTE
HCV AB: NEGATIVE
HEP A IGM: NONREACTIVE
HEP B C IGM: NONREACTIVE
Hepatitis B Surface Ag: NEGATIVE

## 2016-08-28 ENCOUNTER — Ambulatory Visit
Admission: RE | Admit: 2016-08-28 | Discharge: 2016-08-28 | Disposition: A | Discharge status: Home / Self Care | Payer: 59 | Source: Ambulatory Visit | Attending: Medical | Admitting: Medical

## 2016-08-28 DIAGNOSIS — R748 Abnormal levels of other serum enzymes: Secondary | ICD-10-CM

## 2016-08-30 ENCOUNTER — Telehealth: Payer: Self-pay | Admitting: Medical

## 2016-08-30 NOTE — Telephone Encounter (Signed)
I called patient back about results.   He has GI consult next week.  We discuss possible causes of elevated LFTs.  He does note vacation last week and heavy drinking.  Counseled on diet and alcohol, needs to make significant lifestyle changes.  F/u with Dr. Benson Norway

## 2016-09-04 ENCOUNTER — Telehealth: Payer: Self-pay | Admitting: Family Medicine

## 2016-09-04 NOTE — Telephone Encounter (Signed)
Pt left message on voice mail.  I returned call reached pt voice mail lmtrc.

## 2016-09-18 ENCOUNTER — Telehealth: Payer: Self-pay

## 2016-09-18 DIAGNOSIS — R160 Hepatomegaly, not elsewhere classified: Secondary | ICD-10-CM

## 2016-09-20 NOTE — Telephone Encounter (Signed)
error 

## 2016-09-26 LAB — HM COLONOSCOPY

## 2016-09-27 ENCOUNTER — Telehealth: Payer: Self-pay | Admitting: Internal Medicine

## 2016-09-27 NOTE — Telephone Encounter (Signed)
Pt called and states that he wanted you to call him at your convenience to follow-up with you on couple things. He states he had a procedure done yesterday and wanted to talk to you about it

## 2016-09-28 NOTE — Telephone Encounter (Signed)
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

## 2016-09-28 NOTE — Telephone Encounter (Signed)
Pt call him on his cell # [phone]

## 2016-10-04 NOTE — Telephone Encounter (Signed)
pls call and see how he is doing and get his questions.   I was going to call, but we have been crazy busy.  See how his wife is doing as I saw her recently for illness.

## 2016-10-04 NOTE — Telephone Encounter (Signed)
Called and l/m  On voicemail about this .

## 2016-11-11 IMAGING — US US ABDOMEN LIMITED
1 series · 14 of 25 positions shown · non-contrast
Comparison: No recent prior .

CLINICAL DATA: Elevated LFTs.

EXAM:
US ABDOMEN LIMITED - RIGHT UPPER QUADRANT

[Series 1: us abdomen limited · 0.32mm/px · 14 of 44 slices shown]
[im 1/44]
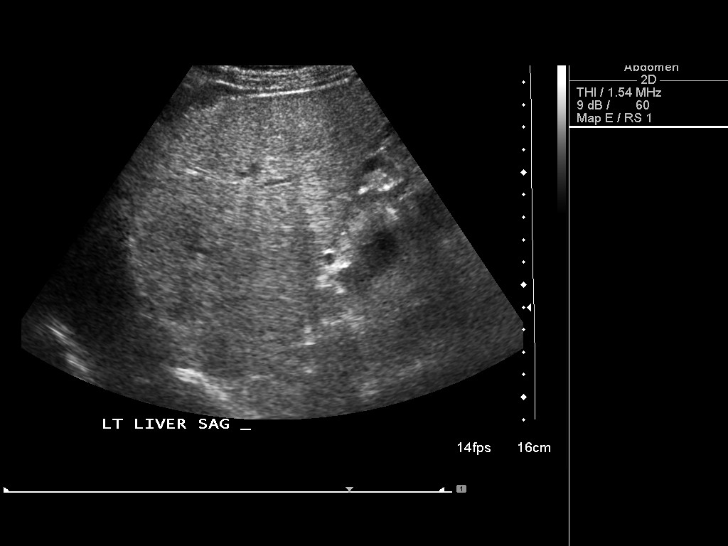
[im 4/44]
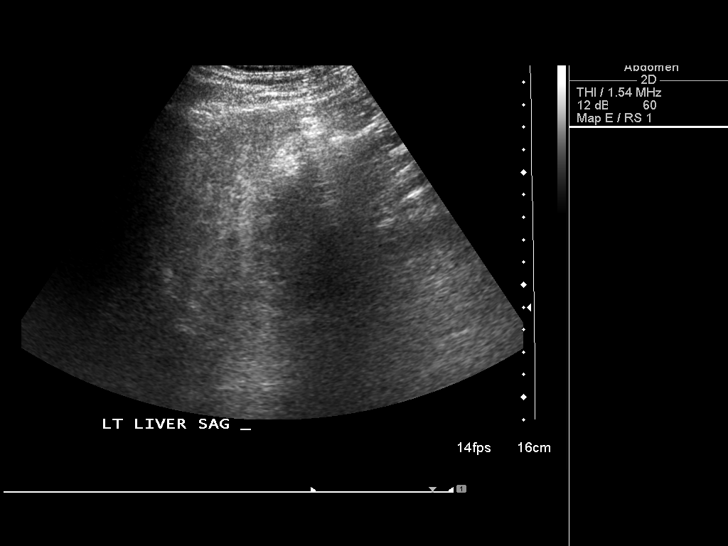
[im 8/44]
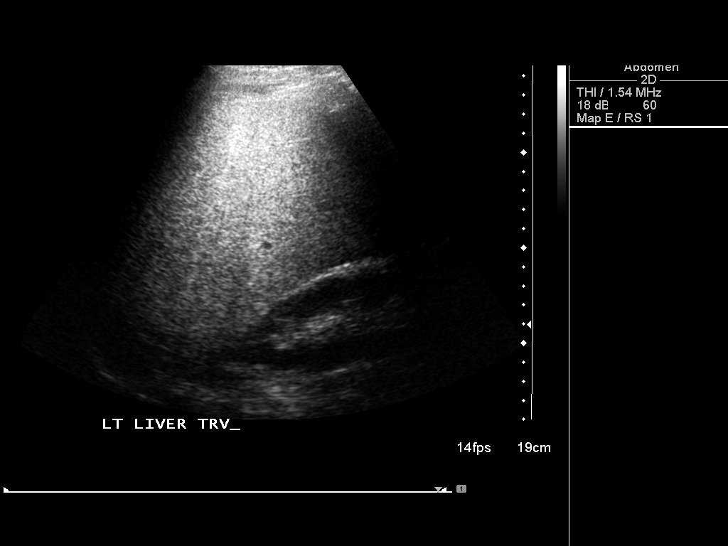
[im 11/44]
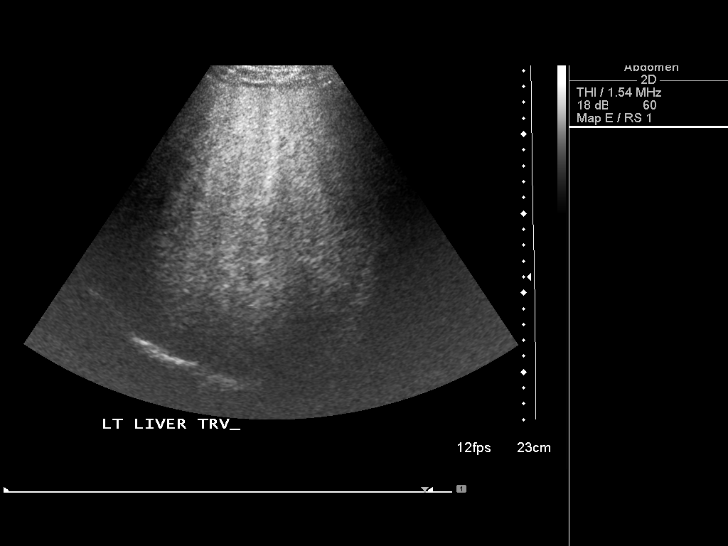
[im 15/44]
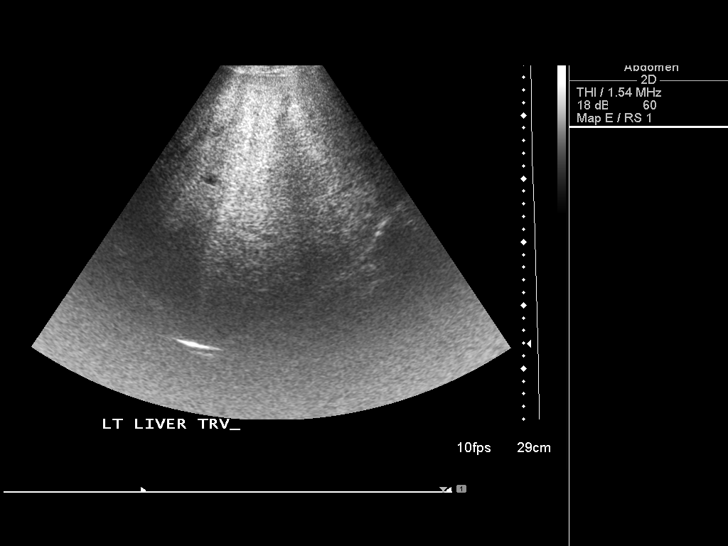
[im 17/44]
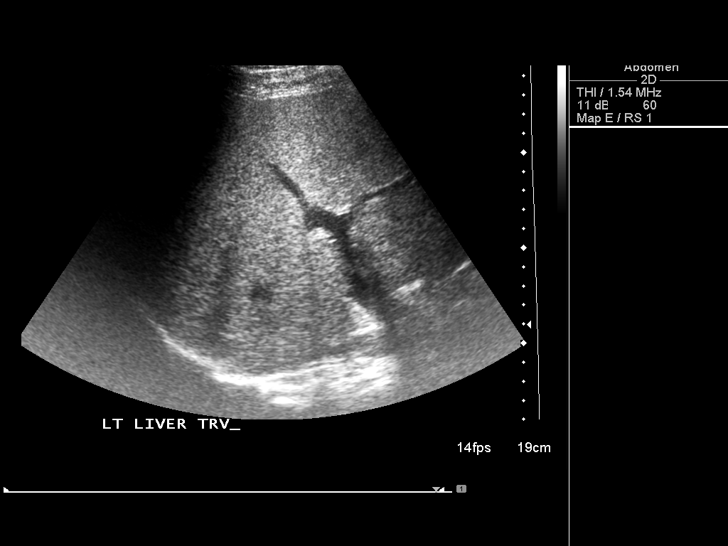
[im 20/44]
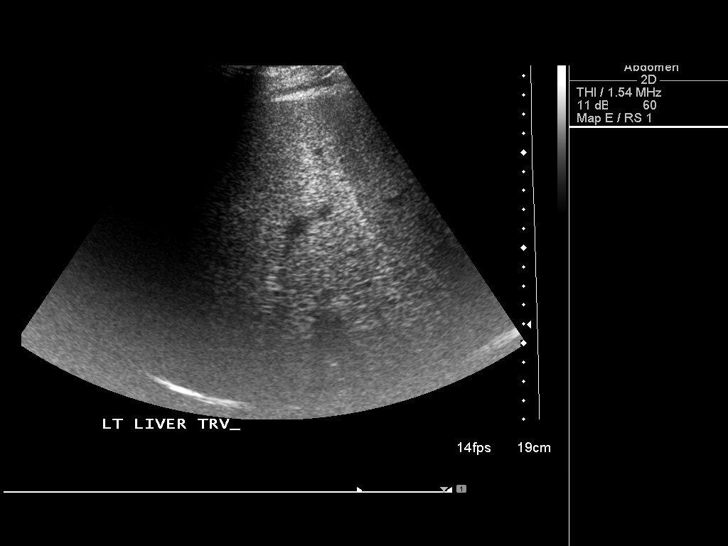
[im 24/44]
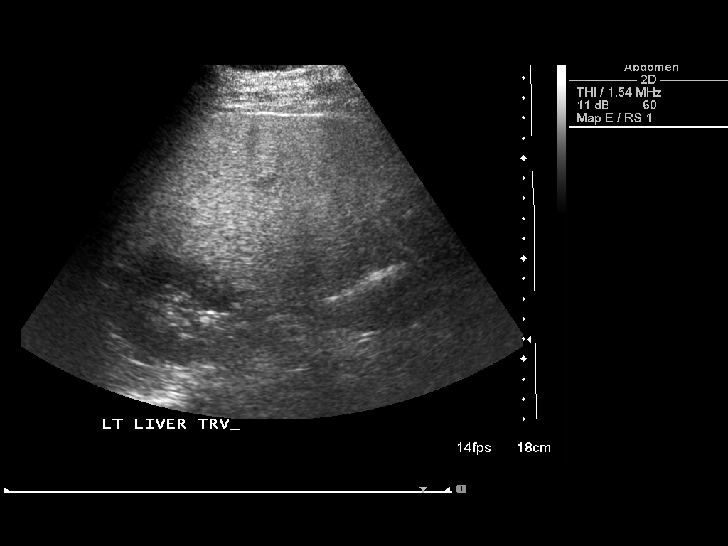
[im 27/44]
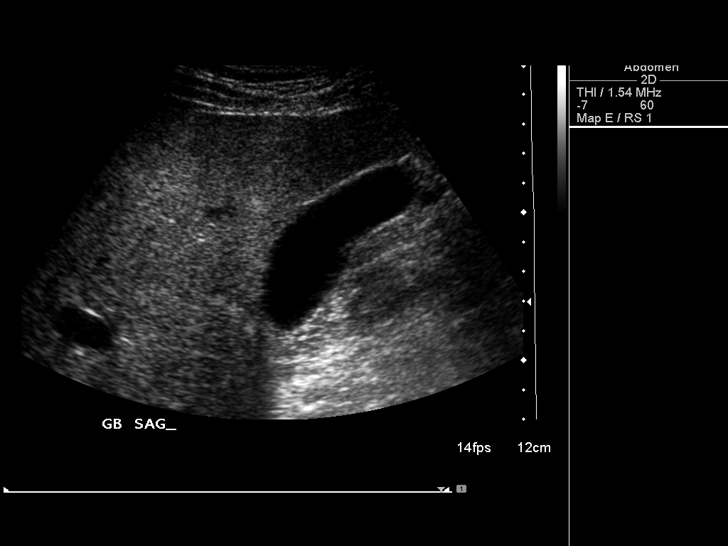
[im 29/44]
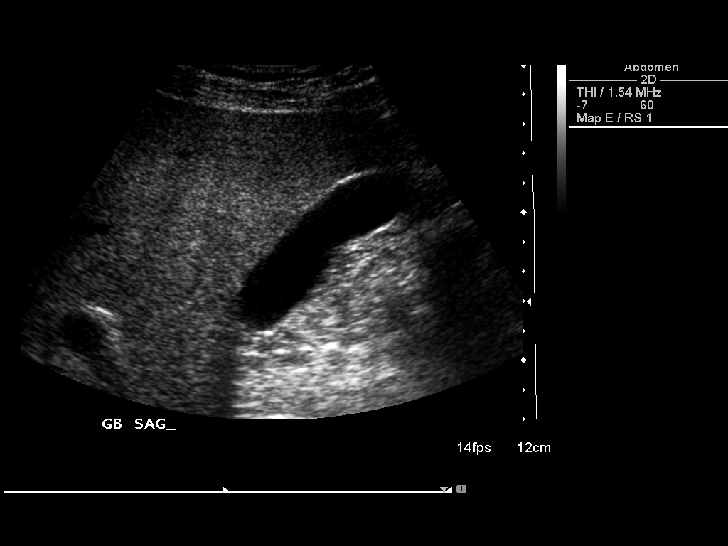
[im 33/44]
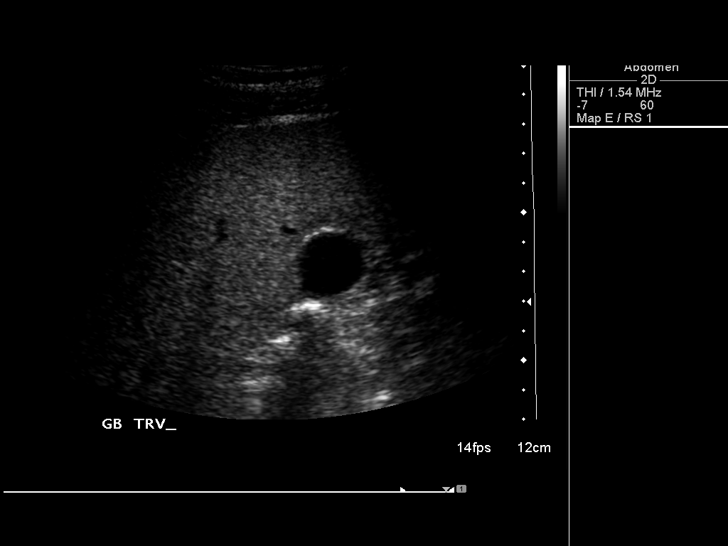
[im 36/44]
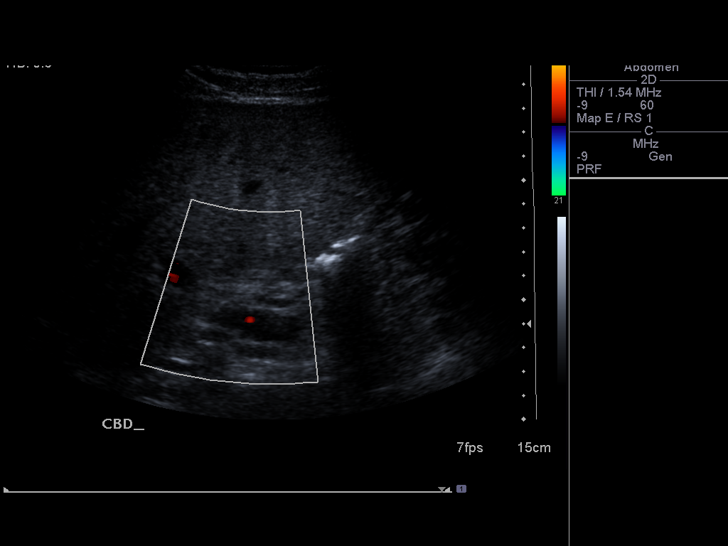
[im 40/44]
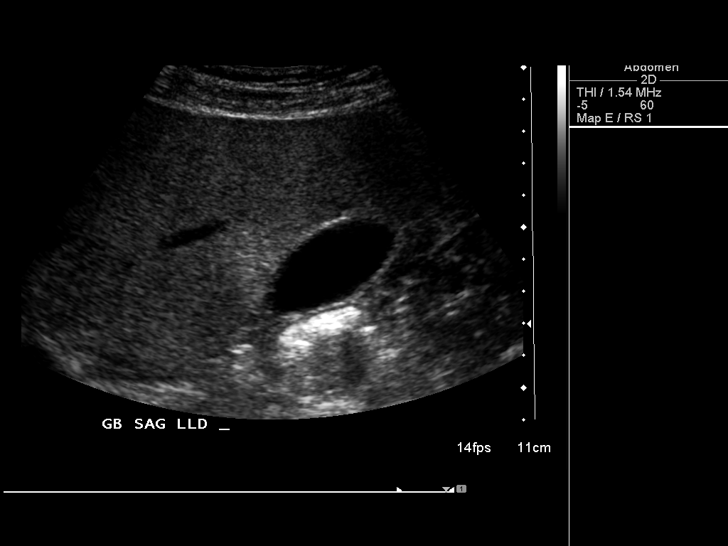
[im 44/44]
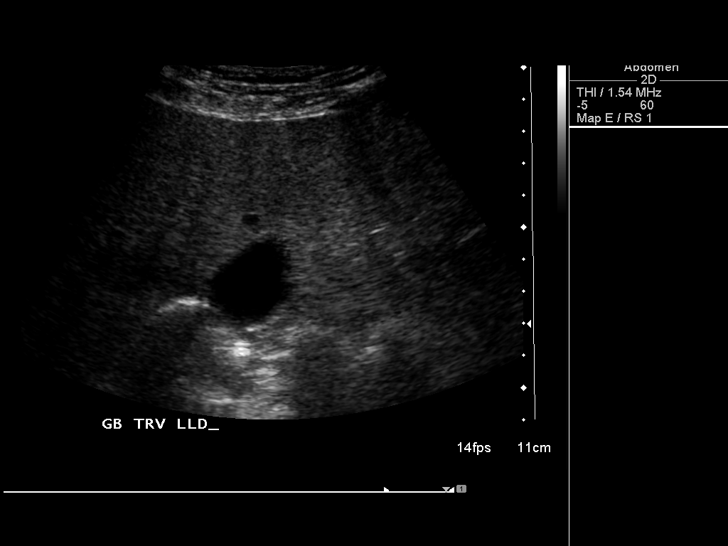

[14 of 25 positions shown; findings below may reference images not displayed]

FINDINGS: Gallbladder:

No gallstones or wall thickening visualized. No sonographic Murphy
sign noted by sonographer.

Common bile duct:

Diameter: 4.8 mm

Liver:

Liver is echogenic suggesting fatty infiltration and/or
hepatocellular disease. Liver appears to be enlarged. No focal
hepatic abnormality identified.
IMPRESSION: 1.  No evidence of gallstones or biliary distention.

2. The liver is echogenic consistent fatty infiltration and/or
hepatocellular disease. Liver appears to be enlarged.

## 2016-11-26 ENCOUNTER — Other Ambulatory Visit: Payer: Self-pay | Admitting: Medical

## 2016-11-26 ENCOUNTER — Telehealth: Payer: Self-pay | Admitting: Family Medicine

## 2016-11-26 NOTE — Telephone Encounter (Signed)
Pt called and states he wants you to start refilling the meds Dr. Collene Mares is giving him and wants to know do you need him to come in for an appt? Omeprazole 40 mg qd Lorazepam 2 mg bid He can't remember the name of the 3rd one but will call back. Please advise if pt needs appt?

## 2016-11-26 NOTE — Telephone Encounter (Signed)
That is probably fine, lets make f/u appt

## 2016-11-27 NOTE — Telephone Encounter (Signed)
Pt has appt scheduled.

## 2016-11-27 NOTE — Telephone Encounter (Signed)
Called and l/m for pt call back  To make an appt to  And about this refill on meds.

## 2016-12-17 ENCOUNTER — Ambulatory Visit (INDEPENDENT_AMBULATORY_CARE_PROVIDER_SITE_OTHER): Payer: 59 | Admitting: Medical

## 2016-12-17 VITALS — BP 138/80 | HR 96 | Wt 241.2 lb

## 2016-12-17 DIAGNOSIS — E559 Vitamin D deficiency, unspecified: Secondary | ICD-10-CM | POA: Diagnosis not present

## 2016-12-17 DIAGNOSIS — E785 Hyperlipidemia, unspecified: Secondary | ICD-10-CM

## 2016-12-17 DIAGNOSIS — K760 Fatty (change of) liver, not elsewhere classified: Secondary | ICD-10-CM | POA: Diagnosis not present

## 2016-12-17 DIAGNOSIS — E663 Overweight: Secondary | ICD-10-CM | POA: Diagnosis not present

## 2016-12-17 DIAGNOSIS — Z2821 Immunization not carried out because of patient refusal: Secondary | ICD-10-CM | POA: Diagnosis not present

## 2016-12-17 DIAGNOSIS — F17200 Nicotine dependence, unspecified, uncomplicated: Secondary | ICD-10-CM

## 2016-12-17 DIAGNOSIS — I10 Essential (primary) hypertension: Secondary | ICD-10-CM | POA: Diagnosis not present

## 2016-12-17 DIAGNOSIS — F988 Other specified behavioral and emotional disorders with onset usually occurring in childhood and adolescence: Secondary | ICD-10-CM | POA: Insufficient documentation

## 2016-12-17 MED ORDER — NALTREXONE-BUPROPION HCL ER 8-90 MG PO TB12: ORAL_TABLET | ORAL | 1 refills | Status: DC

## 2016-12-17 NOTE — Progress Notes (Signed)
Subjective: Chief Complaint  Patient presents with  . med check    med check    Here for med check f/u.   Last visit we restarted statin.    HTN - not compliant with Atenolol 25mg  daily.  He had stopped Lisinopril due to cough in mid 2017.     hyperlipidemia - last visit 06/2016 we advised he restart statin.  He is compliant with Vit D weekly.    He and wife had baby since last visit.  They are excited about this.     Drinking a case per week, on average 3-4 drinks per day on average.  Had colonoscopy due to blood in took, had some polyps, saw Dr. Collene Mares.  She advised lifestyle changes in regards to alcohol and smoking.    She prescribed Omeprazole and Lorazepam to help his symptoms.  Smoking 1 pack every 3 days now.  Has cut down from 1/2-1 PPD.  Using lorazepam occasionally, not every day.  Is using baclofen daily for now.  Can't tell if the baclofen is helping, and it makes he feel clammy and unusual.   It hasn't changed his urges to drink alcohol.   Reviewing Dr. Lorie Apley notes from 09/04/2016, she had prescribed Baclofen 10 mg QHS daily and Lorazepam 2mg  BID regarding anxiety and alcohol  Abuse.   He was prescribe omeprazole 40mg  daily for GERD.    Past Medical History:  Diagnosis Date  . Allergy   . Anxiety   . Bee sting-induced anaphylaxis 2013  . Hyperlipidemia 6/15  . Hypertension 6/15  . Laceration 2009   right hand  . Seizure ([place]) 2009   questionable seizure, syncope, one episode, none since - seen at [facility] ED  . Tobacco use    Current Outpatient Prescriptions on File Prior to Visit  Medication Sig Dispense Refill  . amphetamine-dextroamphetamine (ADDERALL XR) 15 MG 24 hr capsule Take 15 mg by mouth every morning.    Marland Kitchen EPINEPHrine (EPIPEN 2-PAK) 0.3 mg/0.3 mL IJ SOAJ injection Inject 0.3 mLs (0.3 mg total) into the muscle once as needed (for severe allergic reaction). CAll 911 immediately if you have to use this medicine 1 Device 1  . triamcinolone cream (KENALOG) 0.1  % Apply 1 application topically 2 (two) times daily. 454 g 0  . Vitamin D, Ergocalciferol, (DRISDOL) 50000 units CAPS capsule Take 1 capsule (50,000 Units total) by mouth every 7 (seven) days. 12 capsule 3  . atenolol (TENORMIN) 25 MG tablet Take 1 tablet (25 mg total) by mouth daily. (Patient not taking: Reported on 12/17/2016) 90 tablet 3   No current facility-administered medications on file prior to visit.    ROS as in subjective  Objective BP 138/80   Pulse 96   Wt 241 lb 3.2 oz (109.4 kg)   SpO2 97%   BMI 29.36 kg/m   BP Readings from Last 3 Encounters:  12/17/16 138/80  07/16/16 136/82  11/21/15 116/82   Wt Readings from Last 3 Encounters:  12/17/16 241 lb 3.2 oz (109.4 kg)  07/16/16 233 lb 12.8 oz (106.1 kg)  11/21/15 229 lb (103.9 kg)   General appearance: alert, no distress, WD/WN,  Neck: supple, no lymphadenopathy, no thyromegaly, no masses Heart: RRR, normal S1, S2, no murmurs Lungs: CTA bilaterally, no wheezes, rhonchi, or rales Abdomen: +bs, soft, non tender, non distended, no masses, no hepatomegaly, no splenomegaly Pulses: 2+ symmetric, upper and lower extremities, normal cap refill Ext: no edema   Assessment: Encounter Diagnoses  Name Primary?  Marland Kitchen  Essential hypertension Yes  . Hyperlipidemia, unspecified hyperlipidemia type   . Smoker   . Fatty liver   . Influenza vaccination declined   . 23-polyvalent pneumococcal polysaccharide vaccine declined   . Vitamin D deficiency   . Overweight (BMI 25.0-29.9)   . Attention deficit disorder, unspecified hyperactivity presence     Plan: HTN - for now c/t to monitor BPs.  Off atenolol and lisinopril.   He has cut down significant on alcohol and tobacco.    hyperlipidemia - return for fasting labs this week (non fasting today), compliant with Crestor, discussed diet, exercise  C/t efforts to quit smoking  C/t working on limiting alcohol and tobacco, weight management to address fatty liver disease.  Begin  trial of Contrave, discussed risks/benefits of medication.      Overweight, risk factors, comorbidity - begin trial of Contrave  Vit d Deficiency - compliant with weekly medication, recheck Friday for labs  Return for fasting labs as he is not fasting today  ADD- he wants me to take over this medication as his copay is too high with psychiatry.  Can't afford to go there.  We will request records, but that is likely ok.  He has been stable on same medication.  Ovadia was seen today for med check.  Diagnoses and all orders for this visit:  Essential hypertension -     Hepatic function panel; Future -     Basic metabolic panel; Future -     Lipid panel; Future -     VITAMIN D 25 Hydroxy (Vit-D Deficiency, Fractures); Future  Hyperlipidemia, unspecified hyperlipidemia type -     Hepatic function panel; Future -     Lipid panel; Future  Smoker  Fatty liver -     Hepatic function panel; Future  Influenza vaccination declined  23-polyvalent pneumococcal polysaccharide vaccine declined  Vitamin D deficiency -     VITAMIN D 25 Hydroxy (Vit-D Deficiency, Fractures); Future  Overweight (BMI 25.0-29.9)  Attention deficit disorder, unspecified hyperactivity presence  Other orders -     Naltrexone-Bupropion HCl ER 8-90 MG TB12; 1 tablet daily for 5 days, then 1 tablet BID, then taper up as directed   Spent > 45 minutes face to face with patient in discussion of symptoms, evaluation, plan and recommendations.

## 2016-12-20 ENCOUNTER — Other Ambulatory Visit: Payer: 59

## 2016-12-21 ENCOUNTER — Other Ambulatory Visit: Payer: 59

## 2016-12-23 ENCOUNTER — Telehealth: Payer: Self-pay | Admitting: Medical

## 2016-12-23 NOTE — Telephone Encounter (Signed)
P.A. CONTRAVE  

## 2016-12-28 ENCOUNTER — Telehealth: Payer: Self-pay

## 2016-12-28 NOTE — Telephone Encounter (Signed)
Pt called to see if records where recv'd, advised yes they are in your folder for review and you will review and let him know if you Rx him his generic Adderall XR 20mg   (d-amphetamine salt ER 20mg  capsules) takes 1 in am & 1 after lunch #60, he has enough til end of March.   Pt is out of town for the next 3 weeks for work

## 2016-12-28 NOTE — Telephone Encounter (Signed)
Records from [facility] placed in your folder for review. Victorino December

## 2016-12-30 NOTE — Telephone Encounter (Signed)
P.A. Approved til 04/22/17, pt informed, faxed pharmacy

## 2016-12-31 ENCOUNTER — Other Ambulatory Visit: Payer: Self-pay | Admitting: Medical

## 2016-12-31 MED ORDER — AMPHETAMINE-DEXTROAMPHET ER 20 MG PO CP24: 20.0000 mg | ORAL_CAPSULE | Freq: Every day | ORAL | 0 refills | Status: DC

## 2016-12-31 NOTE — Telephone Encounter (Signed)
I reviewed records.  Rx printed for two 30 day supply

## 2017-01-01 NOTE — Telephone Encounter (Signed)
LM that scripts ready for pick up. Victorino December

## 2017-01-29 ENCOUNTER — Other Ambulatory Visit: Payer: Self-pay

## 2017-01-29 MED ORDER — AMPHETAMINE-DEXTROAMPHET ER 20 MG PO CP24: ORAL_CAPSULE | ORAL | 0 refills | Status: DC

## 2017-04-11 ENCOUNTER — Telehealth: Payer: Self-pay | Admitting: Family Medicine

## 2017-04-11 NOTE — Telephone Encounter (Signed)
Pt called and wants referral for vysectomy.  Pt also has follow up with you Tuesday Jun 26

## 2017-04-15 NOTE — Telephone Encounter (Signed)
Refer to Urology.

## 2017-04-16 ENCOUNTER — Ambulatory Visit: Payer: 59 | Admitting: Medical

## 2017-04-19 ENCOUNTER — Encounter: Payer: Self-pay | Admitting: Medical

## 2017-04-19 ENCOUNTER — Ambulatory Visit (INDEPENDENT_AMBULATORY_CARE_PROVIDER_SITE_OTHER): Payer: 59 | Admitting: Medical

## 2017-04-19 VITALS — BP 119/62 | HR 70 | Resp 16 | Wt 246.6 lb

## 2017-04-19 DIAGNOSIS — K760 Fatty (change of) liver, not elsewhere classified: Secondary | ICD-10-CM

## 2017-04-19 DIAGNOSIS — Z87898 Personal history of other specified conditions: Secondary | ICD-10-CM | POA: Diagnosis not present

## 2017-04-19 DIAGNOSIS — F988 Other specified behavioral and emotional disorders with onset usually occurring in childhood and adolescence: Secondary | ICD-10-CM | POA: Diagnosis not present

## 2017-04-19 DIAGNOSIS — E785 Hyperlipidemia, unspecified: Secondary | ICD-10-CM

## 2017-04-19 DIAGNOSIS — F1011 Alcohol abuse, in remission: Secondary | ICD-10-CM

## 2017-04-19 DIAGNOSIS — E559 Vitamin D deficiency, unspecified: Secondary | ICD-10-CM

## 2017-04-19 DIAGNOSIS — I10 Essential (primary) hypertension: Secondary | ICD-10-CM | POA: Diagnosis not present

## 2017-04-19 DIAGNOSIS — R7989 Other specified abnormal findings of blood chemistry: Secondary | ICD-10-CM

## 2017-04-19 DIAGNOSIS — R945 Abnormal results of liver function studies: Secondary | ICD-10-CM

## 2017-04-19 LAB — HEPATIC FUNCTION PANEL
ALBUMIN: 4.9 g/dL (ref 3.6–5.1)
ALT: 50 U/L — ABNORMAL HIGH (ref 9–46)
AST: 29 U/L (ref 10–40)
Alkaline Phosphatase: 68 U/L (ref 40–115)
BILIRUBIN DIRECT: 0.2 mg/dL (ref <=0.2)
Indirect Bilirubin: 0.9 mg/dL (ref 0.2–1.2)
TOTAL PROTEIN: 7 g/dL (ref 6.1–8.1)
Total Bilirubin: 1.1 mg/dL (ref 0.2–1.2)

## 2017-04-19 LAB — BASIC METABOLIC PANEL
BUN: 12 mg/dL (ref 7–25)
CALCIUM: 9.6 mg/dL (ref 8.6–10.3)
CO2: 24 mmol/L (ref 20–31)
Chloride: 103 mmol/L (ref 98–110)
Creat: 0.77 mg/dL (ref 0.60–1.35)
GLUCOSE: 88 mg/dL (ref 65–99)
Potassium: 4.1 mmol/L (ref 3.5–5.3)
SODIUM: 138 mmol/L (ref 135–146)

## 2017-04-19 LAB — LIPID PANEL
CHOL/HDL RATIO: 3.6 ratio (ref <5.0)
CHOLESTEROL: 115 mg/dL (ref <200)
HDL: 32 mg/dL — AB (ref >40)
LDL Cholesterol: 65 mg/dL (ref <100)
Triglycerides: 92 mg/dL (ref <150)
VLDL: 18 mg/dL (ref <30)

## 2017-04-19 NOTE — Progress Notes (Signed)
Subjective: Chief Complaint  Patient presents with  . Medication Management    f/u rehab- 28 day detox on 02/20/2017. [facility]- he is now 40 days sober.    Here for f/u.   Lately he had been having trouble stopping alcohol.  Found himself drinking more and more, earlier in the day.  Was worried about his elevated LFTs. Dec[facility].   Did 28 day program, and is now sober 58 days.  Feels better. Lost his job while in rehab despite [place].    Had labs when he entered [facility]. initially liver test high, but last labs a month ago were much improved.   Left the program 03/20/17.  Had labs upon discharge that were much improved.   Doing intensive outpatient therapy 8 weeks.   He discontinued Adderall for now for this program, doing random drug tests, so plans to stay clean to stay in counseling.  In a much better place now.  Has sponsor through [place], seeing sponsor today.    Wife works for [employer], Forensic psychologist.    She is supportive of his treatment recently.   She has been attending meetings to understand what he is going through.  No other aggravating or relieving factors. No other complaint.  Past Medical History:  Diagnosis Date  . Allergy   . Anxiety   . Bee sting-induced anaphylaxis 2013  . Hyperlipidemia 6/15  . Hypertension 6/15  . Laceration 2009   right hand  . Seizure ([place]) 2009   questionable seizure, syncope, one episode, none since - seen at [facility] ED  . Tobacco use    Current Outpatient Prescriptions on File Prior to Visit  Medication Sig Dispense Refill  . EPINEPHrine (EPIPEN 2-PAK) 0.3 mg/0.3 mL IJ SOAJ injection Inject 0.3 mLs (0.3 mg total) into the muscle once as needed (for severe allergic reaction). CAll 911 immediately if you have to use this medicine (Patient not taking: Reported on 04/19/2017) 1 Device 1  . triamcinolone cream (KENALOG) 0.1 % Apply 1 application topically 2 (two) times daily. (Patient not taking:  Reported on 04/19/2017) 454 g 0   No current facility-administered medications on file prior to visit.    ROS as in subjective   Objective: BP 119/62   Pulse 70   Resp 16   Wt 246 lb 9.6 oz (111.9 kg)   SpO2 96%   BMI 30.02 kg/m   Wt Readings from Last 3 Encounters:  04/19/17 246 lb 9.6 oz (111.9 kg)  12/17/16 241 lb 3.2 oz (109.4 kg)  07/16/16 233 lb 12.8 oz (106.1 kg)   BP Readings from Last 3 Encounters:  04/19/17 119/62  12/17/16 138/80  07/16/16 136/82   Gen: wd, wn, nad Psych: pleasant, less anxious appearing as in the past, calm demeanor, answers questions approprietly    Assessment: Encounter Diagnoses  Name Primary?  . Essential hypertension Yes  . History of alcohol abuse   . Elevated LFTs   . Attention deficit disorder, unspecified hyperactivity presence   . Hyperlipidemia, unspecified hyperlipidemia type   . Fatty liver   . Vitamin D deficiency      Plan: Glad to hear he completed a treatment program and is doing well, off alcohol.   We discussed his efforts now and his desire to stay sober. He is compliance with Lexapro 10mg , HCTZ 25mg , Crestor 40, omeprazole.  He is taking a break from Adderall currently.  He is looking for another job  at this point.  Encouraged him to stay with the counseling program, avoid temptations or opportunities to be exposure to alcohol.   Repeat labs today.  Wesley Cruz was seen today for medication management.  Diagnoses and all orders for this visit:  Essential hypertension -     Hepatic function panel -     Basic metabolic panel -     Lipid panel -     VITAMIN D 25 Hydroxy (Vit-D Deficiency, Fractures)  History of alcohol abuse  Elevated LFTs  Attention deficit disorder, unspecified hyperactivity presence  Hyperlipidemia, unspecified hyperlipidemia type -     Hepatic function panel -     Lipid panel  Fatty liver -     Hepatic function panel  Vitamin D deficiency -     VITAMIN D 25 Hydroxy (Vit-D Deficiency,  Fractures)

## 2017-04-20 LAB — VITAMIN D 25 HYDROXY (VIT D DEFICIENCY, FRACTURES): Vit D, 25-Hydroxy: 35 ng/mL (ref 30–100)

## 2017-05-28 ENCOUNTER — Telehealth: Payer: Self-pay

## 2017-05-28 NOTE — Telephone Encounter (Signed)
Pt left voicemail asking for callback in regards to questions about his medications. Pease call back  [phone]. Victorino December

## 2017-05-28 NOTE — Telephone Encounter (Signed)
L/m to let him know that I was returning his call.

## 2017-05-30 MED ORDER — HYDROCHLOROTHIAZIDE 25 MG PO TABS: 25.0000 mg | ORAL_TABLET | Freq: Every day | ORAL | 0 refills | Status: DC

## 2017-05-30 MED ORDER — OMEGA-3-ACID ETHYL ESTERS 1 G PO CAPS: 1.0000 | ORAL_CAPSULE | Freq: Every day | ORAL | 2 refills | Status: DC

## 2017-05-30 MED ORDER — ROSUVASTATIN CALCIUM 40 MG PO TABS: 40.0000 mg | ORAL_TABLET | Freq: Every day | ORAL | 0 refills | Status: DC

## 2017-05-30 NOTE — Telephone Encounter (Signed)
Pt called back and said that he needed refill on  His meds . He said that he call the pharmacy and to the meds. I told him that we have not gotten anything from his pharmacy. Sent in refill to the pharmacy.

## 2017-05-31 ENCOUNTER — Telehealth: Payer: Self-pay | Admitting: Medical

## 2017-05-31 ENCOUNTER — Other Ambulatory Visit: Payer: Self-pay | Admitting: Medical

## 2017-05-31 MED ORDER — OMEGA-3-ACID ETHYL ESTERS 1 G PO CAPS: 2.0000 g | ORAL_CAPSULE | Freq: Two times a day (BID) | ORAL | 2 refills | Status: DC

## 2017-05-31 NOTE — Telephone Encounter (Addendum)
Pt called stating that he takes #3 per day of the Omega 3 so he will need more of them sent in to the pharmacy and will need the directions changed  Pt wants to be called when this is corrected.

## 2017-05-31 NOTE — Telephone Encounter (Signed)
I sent the correct Lovaza fish oil dosing to pharmacy

## 2017-07-23 ENCOUNTER — Ambulatory Visit (INDEPENDENT_AMBULATORY_CARE_PROVIDER_SITE_OTHER): Payer: 59 | Admitting: Medical

## 2017-07-23 ENCOUNTER — Ambulatory Visit
Admission: RE | Admit: 2017-07-23 | Discharge: 2017-07-23 | Disposition: A | Discharge status: Home / Self Care | Payer: 59 | Source: Ambulatory Visit | Attending: Medical | Admitting: Medical

## 2017-07-23 ENCOUNTER — Encounter: Payer: Self-pay | Admitting: Medical

## 2017-07-23 VITALS — BP 138/80 | HR 73 | Wt 250.6 lb

## 2017-07-23 DIAGNOSIS — F17200 Nicotine dependence, unspecified, uncomplicated: Secondary | ICD-10-CM

## 2017-07-23 DIAGNOSIS — M19071 Primary osteoarthritis, right ankle and foot: Secondary | ICD-10-CM | POA: Diagnosis not present

## 2017-07-23 DIAGNOSIS — M79671 Pain in right foot: Secondary | ICD-10-CM

## 2017-07-23 DIAGNOSIS — I10 Essential (primary) hypertension: Secondary | ICD-10-CM | POA: Diagnosis not present

## 2017-07-23 DIAGNOSIS — Z2821 Immunization not carried out because of patient refusal: Secondary | ICD-10-CM | POA: Diagnosis not present

## 2017-07-23 DIAGNOSIS — F1021 Alcohol dependence, in remission: Secondary | ICD-10-CM | POA: Diagnosis not present

## 2017-07-23 DIAGNOSIS — E785 Hyperlipidemia, unspecified: Secondary | ICD-10-CM | POA: Diagnosis not present

## 2017-07-23 DIAGNOSIS — R945 Abnormal results of liver function studies: Secondary | ICD-10-CM | POA: Diagnosis not present

## 2017-07-23 DIAGNOSIS — R7989 Other specified abnormal findings of blood chemistry: Secondary | ICD-10-CM

## 2017-07-23 NOTE — Progress Notes (Signed)
Subjective:  Wesley Cruz is a 36 y.o. male who presents for recheck.  Alcoholism, anxiety - Sober 71mo as of this week.  Stopped lexapro 2 mo ago as it seemed to be causing lethargy.   Does [facility] early recover group counseling currently, does this week.   Not doing one on one counseling currently.   Overall still doing well, in a good place spiritually.     HTN - taking HCTZ 25mg , doing ok.  Hyperlipidemia - taking Crestor 40mg  and fish oil without c/o  Still smoking.   He notes having some pains in foot. Has hx/o fracture in left foot. Is having a pain in right foot like he has in left foot in the past.   When he stands a certain way, gets a sharp pain in the foot.  No specific injury or trauma but was playing basketball a few weeks ago, not sure if injured it then.    No other aggravating or relieving factors.    No other c/o.  The following portions of the patient's history were reviewed and updated as appropriate: allergies, current medications, past family history, past medical history, past social history, past surgical history and problem list.  ROS Otherwise as in subjective above     Objective:Exam BP 138/80   Pulse 73   Wt 250 lb 9.6 oz (113.7 kg)   SpO2 97%   BMI 30.50 kg/m   Wt Readings from Last 3 Encounters:  07/23/17 250 lb 9.6 oz (113.7 kg)  04/19/17 246 lb 9.6 oz (111.9 kg)  12/17/16 241 lb 3.2 oz (109.4 kg)   BP Readings from Last 3 Encounters:  07/23/17 138/80  04/19/17 119/62  12/17/16 138/80   General appearance: alert, no distress, WD/WN Tender over right 3rd -5th metatarsals, otherwise non tender, no swelling.   Decent arches.  No other deformity.  Normal foot ankle and toe ROM.  Otherwise feet and legs non tender, normal ROM. Legs neurovascularly intact    Assessment: Encounter Diagnoses  Name Primary?  . Essential hypertension Yes  . Elevated LFTs   . Hyperlipidemia, unspecified hyperlipidemia type   . Recovering alcoholic ([place])   .  Smoker   . Right foot pain      Plan: HTN - c/t same medication, c/t periodic BP monitoring at home hyperlipidemia - c/t same medication, reviewed 03/2017 labs.  Plan f/u 1year. Glad to hear he is still sober and continues in group therapy. He will eventually work on quitting tobacco Right foot pain - go for xray Follow up: pending xray   Rondey was seen today for follow-up.  Diagnoses and all orders for this visit:  Essential hypertension  Elevated LFTs  Hyperlipidemia, unspecified hyperlipidemia type  Recovering alcoholic ([place])  Smoker  Right foot pain -     DG Foot Complete Right; Future

## 2017-08-12 ENCOUNTER — Telehealth: Payer: Self-pay | Admitting: Medical

## 2017-08-12 NOTE — Telephone Encounter (Signed)
We can do a screen for this next visit (TIBC /iron, ferritin level)

## 2017-08-12 NOTE — Telephone Encounter (Signed)
Pt called and stated that he recently had a Uncle diagnosed with hemochromatosis. It was recommended by his uncle's doctor that all children and nieces and nephews be tested. He would like to know if this is necessary for him. Please call pt at [phone].

## 2017-08-12 NOTE — Telephone Encounter (Signed)
Called and notified pt

## 2017-09-14 ENCOUNTER — Other Ambulatory Visit: Payer: Self-pay | Admitting: Medical

## 2017-10-29 ENCOUNTER — Other Ambulatory Visit: Payer: Self-pay | Admitting: Medical

## 2017-11-26 ENCOUNTER — Telehealth: Payer: Self-pay | Admitting: Medical

## 2017-11-26 ENCOUNTER — Other Ambulatory Visit: Payer: Self-pay | Admitting: Medical

## 2017-11-26 NOTE — Telephone Encounter (Signed)
Pt played basketball and now his left foot is hurting again. On a 10 pain scale, he has been at a 7 pain level all morning. Foot is slightly swollen. Can Audelia Acton put in an order for pt to go have another xray to make sure he has not reinjured his foot?

## 2017-11-28 NOTE — Telephone Encounter (Signed)
Pt called back to check on status.

## 2017-11-28 NOTE — Telephone Encounter (Signed)
I recently saw him for right foot pain, and xray was done on right foot.     I would certainly use rest, ice, elevated, and Ibuprofen or similar OTC.   If not improving within 4-5 days where he can bear weight and have much less pain, then come in.

## 2017-11-29 ENCOUNTER — Other Ambulatory Visit: Payer: Self-pay | Admitting: Medical

## 2017-11-29 DIAGNOSIS — M79672 Pain in left foot: Secondary | ICD-10-CM

## 2017-11-29 DIAGNOSIS — S99922A Unspecified injury of left foot, initial encounter: Secondary | ICD-10-CM

## 2017-11-29 NOTE — Telephone Encounter (Signed)
Called and notified pt of this , he is going to have this done on Monday

## 2017-11-29 NOTE — Telephone Encounter (Signed)
It was left foot and injury needs an order x-rays, and this new injury

## 2017-11-29 NOTE — Telephone Encounter (Signed)
My point from the previous message was that I had seen him prior for the "right" foot not the left.    Thus this is a new or different issue that we would normally see them for as an appointment.  He can go for the x-ray, order is in.  Give hours for [facility]  Make a follow-up appointment for next week to review the x-ray and examine his foot

## 2017-12-02 ENCOUNTER — Ambulatory Visit
Admission: RE | Admit: 2017-12-02 | Discharge: 2017-12-02 | Disposition: A | Discharge status: Home / Self Care | Payer: 59 | Source: Ambulatory Visit | Attending: Medical | Admitting: Medical

## 2017-12-02 DIAGNOSIS — S99922A Unspecified injury of left foot, initial encounter: Secondary | ICD-10-CM

## 2017-12-02 DIAGNOSIS — M79672 Pain in left foot: Secondary | ICD-10-CM | POA: Diagnosis not present

## 2017-12-04 ENCOUNTER — Encounter: Payer: Self-pay | Admitting: Medical

## 2017-12-04 ENCOUNTER — Ambulatory Visit: Payer: Self-pay | Admitting: Medical

## 2017-12-04 ENCOUNTER — Ambulatory Visit: Payer: 59 | Admitting: Medical

## 2017-12-04 VITALS — BP 142/86 | HR 79 | Wt 253.8 lb

## 2017-12-04 DIAGNOSIS — S99912A Unspecified injury of left ankle, initial encounter: Secondary | ICD-10-CM

## 2017-12-04 DIAGNOSIS — R945 Abnormal results of liver function studies: Secondary | ICD-10-CM | POA: Diagnosis not present

## 2017-12-04 DIAGNOSIS — Z3009 Encounter for other general counseling and advice on contraception: Secondary | ICD-10-CM

## 2017-12-04 DIAGNOSIS — M21962 Unspecified acquired deformity of left lower leg: Secondary | ICD-10-CM | POA: Diagnosis not present

## 2017-12-04 DIAGNOSIS — F1021 Alcohol dependence, in remission: Secondary | ICD-10-CM | POA: Diagnosis not present

## 2017-12-04 DIAGNOSIS — I10 Essential (primary) hypertension: Secondary | ICD-10-CM | POA: Diagnosis not present

## 2017-12-04 DIAGNOSIS — M21961 Unspecified acquired deformity of right lower leg: Secondary | ICD-10-CM

## 2017-12-04 DIAGNOSIS — R7989 Other specified abnormal findings of blood chemistry: Secondary | ICD-10-CM

## 2017-12-04 NOTE — Progress Notes (Signed)
Subjective: Chief Complaint  Patient presents with  . Foot Pain    foot pain left , pt had x-ray done , start last week , swelling    Here for mass ankle and foot pain.  Was playing basketball last week about a week ago.  In recent weeks he has been playing basketball 3 times per week. Hasn't been having pain, but the day he called in here, he had awoke a few days before and couldn't put weight on it.   If stand on tip toe on left foot, pain is excruciating and left ankle.  No swelling.  No other specific injury trauma or fall  He would like a consult for vasectomy.    He continues to be sober for several months now would like his liver test rechecked today  Compliant with blood pressure medication  Past Medical History:  Diagnosis Date  . Allergy   . Anxiety   . Bee sting-induced anaphylaxis 2013  . Hyperlipidemia 6/15  . Hypertension 6/15  . Laceration 2009   right hand  . Seizure ([place]) 2009   questionable seizure, syncope, one episode, none since - seen at [facility] ED  . Tobacco use    Current Outpatient Medications on File Prior to Visit  Medication Sig Dispense Refill  . EPINEPHrine (EPIPEN 2-PAK) 0.3 mg/0.3 mL IJ SOAJ injection Inject 0.3 mLs (0.3 mg total) into the muscle once as needed (for severe allergic reaction). CAll 911 immediately if you have to use this medicine 1 Device 1  . hydrochlorothiazide (HYDRODIURIL) 25 MG tablet TAKE 1 TABLET(25 MG) BY MOUTH DAILY 90 tablet 0  . omega-3 acid ethyl esters (LOVAZA) 1 g capsule TAKE 2 CAPSULES BY MOUTH TWICE DAILY 120 capsule 0  . rosuvastatin (CRESTOR) 40 MG tablet TAKE 1 TABLET(40 MG) BY MOUTH DAILY 90 tablet 0   No current facility-administered medications on file prior to visit.    ROS as in subjective   Objective: BP (!) 142/86   Pulse 79   Wt 253 lb 12.8 oz (115.1 kg)   SpO2 98%   BMI 30.89 kg/m   Gen: wd, wn ,nad Skin: unremarkable MSK: Tender over the left ATFL, pain with flexion or extension of the second  and third toes, bilateral fourth and fifth toes deviate quite a bit inward, otherwise feet and ankles nontender, normal range of motion, rest the leg exam unremarkable Feet and ankle with normal strength and sensation Feet Pulses 1+, cap refill slightly delayed   Assessment: Encounter Diagnoses  Name Primary?  Marland Kitchen Ankle injuries, left, initial encounter Yes  . Elevated LFTs   . Essential hypertension   . Recovering alcoholic ([place])   . Visit for vasectomy evaluation   . Foot deformity, bilateral     Plan: Ankle injury, ligament sprain-we discussed the short-term.  We discussed a short-term period of 7-10 days of relative rest, ice, ankle brace which he has at home, elevate the leg, Aleve over-the-counter 1-2 tablets twice daily for 7-10 days.  If much improved within 2 weeks then discussed some home strengthening exercises.  Follow-up in 2 weeks  Foot deformity-discussed the findings and deviation of the fourth and fifth toes.  Reviewed his recent x-rays  Elevated LFTs, recovering alcoholic- repeat liver test today but he remains sober for numerous months now, congratulated him on this  Hypertension-continue same medication  Referral to urology for vasectomy consult  Desi was seen today for foot pain.  Diagnoses and all orders for this visit:  Ankle  injuries, left, initial encounter  Elevated LFTs -     Hepatic function panel  Essential hypertension  Recovering alcoholic ([place])  Visit for vasectomy evaluation -     Ambulatory referral to Urology  Foot deformity, bilateral

## 2017-12-05 LAB — HEPATIC FUNCTION PANEL
ALT: 59 IU/L — AB (ref 0–44)
AST: 28 IU/L (ref 0–40)
Albumin: 5.4 g/dL (ref 3.5–5.5)
Alkaline Phosphatase: 87 IU/L (ref 39–117)
Bilirubin Total: 0.6 mg/dL (ref 0.0–1.2)
Bilirubin, Direct: 0.18 mg/dL (ref 0.00–0.40)
TOTAL PROTEIN: 7.9 g/dL (ref 6.0–8.5)

## 2017-12-06 ENCOUNTER — Ambulatory Visit: Payer: Self-pay | Admitting: Medical

## 2017-12-16 ENCOUNTER — Telehealth: Payer: Self-pay | Admitting: Medical

## 2017-12-16 NOTE — Telephone Encounter (Signed)
Do you have this form?

## 2017-12-16 NOTE — Telephone Encounter (Signed)
Pt dropped on form to be completed for him to get life insurance through his wife's job. Call pt when form is complete.

## 2017-12-17 NOTE — Telephone Encounter (Signed)
Put on your desk 

## 2017-12-17 NOTE — Telephone Encounter (Signed)
I don't see a form

## 2017-12-29 DIAGNOSIS — R21 Rash and other nonspecific skin eruption: Secondary | ICD-10-CM | POA: Diagnosis not present

## 2018-01-19 ENCOUNTER — Other Ambulatory Visit: Payer: Self-pay | Admitting: Medical

## 2018-01-25 ENCOUNTER — Other Ambulatory Visit: Payer: Self-pay | Admitting: Medical

## 2018-02-05 DIAGNOSIS — Z302 Encounter for sterilization: Secondary | ICD-10-CM | POA: Diagnosis not present

## 2018-02-15 ENCOUNTER — Other Ambulatory Visit: Payer: Self-pay | Admitting: Medical

## 2018-02-17 ENCOUNTER — Telehealth: Payer: Self-pay | Admitting: Medical

## 2018-02-17 ENCOUNTER — Other Ambulatory Visit: Payer: Self-pay | Admitting: Medical

## 2018-02-17 DIAGNOSIS — B3749 Other urogenital candidiasis: Secondary | ICD-10-CM | POA: Diagnosis not present

## 2018-02-17 MED ORDER — ROSUVASTATIN CALCIUM 40 MG PO TABS: ORAL_TABLET | ORAL | 0 refills | Status: DC

## 2018-02-17 MED ORDER — HYDROCHLOROTHIAZIDE 25 MG PO TABS: ORAL_TABLET | ORAL | 0 refills | Status: DC

## 2018-02-17 NOTE — Telephone Encounter (Signed)
done

## 2018-02-17 NOTE — Telephone Encounter (Signed)
Pt has CPE scheduled 03/18/18 needs refill Crestor and HCTZ to [facility] & would like 90 days if possible

## 2018-02-17 NOTE — Telephone Encounter (Signed)
Looks like a note

## 2018-02-27 ENCOUNTER — Telehealth: Payer: Self-pay

## 2018-02-27 NOTE — Telephone Encounter (Signed)
Patient's wife called wanting to know if they need to come in for booster MMR they are going to [facility] on Sunday.  Also wants to know if they could have a RX of Cipro called to the pharmacy.  Please advice and call Curt Bears at [phone]

## 2018-02-27 NOTE — Telephone Encounter (Signed)
Pt called no aanswer lvm . [place]

## 2018-02-27 NOTE — Telephone Encounter (Signed)
Based on his age he should be covered for MMR.  Prefer not to give an antibiotic.

## 2018-02-28 NOTE — Telephone Encounter (Signed)
Patients wife notified

## 2018-03-03 ENCOUNTER — Other Ambulatory Visit: Payer: Self-pay | Admitting: Medical

## 2018-03-03 NOTE — Telephone Encounter (Signed)
Called pt to see if he had enough of omega 3 . No answr lvm Wesley Cruz

## 2018-03-10 DIAGNOSIS — B3749 Other urogenital candidiasis: Secondary | ICD-10-CM | POA: Diagnosis not present

## 2018-03-18 ENCOUNTER — Encounter: Payer: Self-pay | Admitting: Medical

## 2018-03-18 ENCOUNTER — Ambulatory Visit: Payer: 59 | Admitting: Medical

## 2018-03-18 VITALS — BP 130/86 | HR 83 | Temp 98.2°F | Ht 74.75 in | Wt 254.8 lb

## 2018-03-18 DIAGNOSIS — Z7189 Other specified counseling: Secondary | ICD-10-CM

## 2018-03-18 DIAGNOSIS — K760 Fatty (change of) liver, not elsewhere classified: Secondary | ICD-10-CM | POA: Diagnosis not present

## 2018-03-18 DIAGNOSIS — F988 Other specified behavioral and emotional disorders with onset usually occurring in childhood and adolescence: Secondary | ICD-10-CM | POA: Diagnosis not present

## 2018-03-18 DIAGNOSIS — R945 Abnormal results of liver function studies: Secondary | ICD-10-CM

## 2018-03-18 DIAGNOSIS — E663 Overweight: Secondary | ICD-10-CM

## 2018-03-18 DIAGNOSIS — L409 Psoriasis, unspecified: Secondary | ICD-10-CM | POA: Diagnosis not present

## 2018-03-18 DIAGNOSIS — Z87898 Personal history of other specified conditions: Secondary | ICD-10-CM | POA: Diagnosis not present

## 2018-03-18 DIAGNOSIS — F17200 Nicotine dependence, unspecified, uncomplicated: Secondary | ICD-10-CM | POA: Diagnosis not present

## 2018-03-18 DIAGNOSIS — Z0000 Encounter for general adult medical examination without abnormal findings: Secondary | ICD-10-CM

## 2018-03-18 DIAGNOSIS — E559 Vitamin D deficiency, unspecified: Secondary | ICD-10-CM

## 2018-03-18 DIAGNOSIS — I10 Essential (primary) hypertension: Secondary | ICD-10-CM

## 2018-03-18 DIAGNOSIS — R7989 Other specified abnormal findings of blood chemistry: Secondary | ICD-10-CM

## 2018-03-18 DIAGNOSIS — F1021 Alcohol dependence, in remission: Secondary | ICD-10-CM | POA: Diagnosis not present

## 2018-03-18 DIAGNOSIS — Z7185 Encounter for immunization safety counseling: Secondary | ICD-10-CM

## 2018-03-18 DIAGNOSIS — E785 Hyperlipidemia, unspecified: Secondary | ICD-10-CM

## 2018-03-18 DIAGNOSIS — F1011 Alcohol abuse, in remission: Secondary | ICD-10-CM

## 2018-03-18 LAB — POCT URINALYSIS DIP (PROADVANTAGE DEVICE)
Bilirubin, UA: NEGATIVE
GLUCOSE UA: NEGATIVE mg/dL
Ketones, POC UA: NEGATIVE mg/dL
Leukocytes, UA: NEGATIVE
NITRITE UA: NEGATIVE
PH UA: 6 (ref 5.0–8.0)
PROTEIN UA: NEGATIVE mg/dL
RBC UA: NEGATIVE

## 2018-03-18 NOTE — Patient Instructions (Addendum)
Thanks for trusting [place] with your health care and for coming in for a physical today.  Below are some general recommendations I have for you:  Yearly screenings See your eye doctor yearly for routine vision care. See your dentist yearly for routine dental care including hygiene visits twice yearly. See me here yearly for a routine physical and preventative care visit   Specific Concerns today:  . I recommend you consider [place] doing a Pneumonia vaccine . Get established with an eye doctor   Please follow up yearly for a physical.   Preventative Care for Adults - Male      [place]:  A routine yearly physical is a good way to check in with your primary care provider about your health and preventive screening. It is also an opportunity to share updates about your health and any concerns you have, and receive a thorough all-over exam.   Most health insurance companies pay for at least some preventative services.  Check with your health plan for specific coverages.  WHAT PREVENTATIVE SERVICES DO WOMEN NEED?  Adult men should have their weight and blood pressure checked regularly.   Men age 66 and older should have their cholesterol levels checked regularly.  Beginning at age 62 and continuing to age 30, men should be screened for colorectal cancer.  Certain people may need continued testing until age 46.  Updating vaccinations is part of preventative care.  Vaccinations help protect against diseases such as the flu.  Osteoporosis is a disease in which the bones lose minerals and strength as we age. Men ages 86 and over should discuss this with their caregivers  Lab tests are generally done as part of preventative care to screen for anemia and blood disorders, to screen for problems with the kidneys and liver, to screen for bladder problems, to check blood sugar, and to check your cholesterol level.  Preventative services generally include counseling about diet,  exercise, avoiding tobacco, drugs, excessive alcohol consumption, and sexually transmitted infections.    GENERAL RECOMMENDATIONS FOR GOOD HEALTH:  Healthy diet:  Eat a variety of foods, including fruit, vegetables, animal or vegetable protein, such as meat, fish, chicken, and eggs, or beans, lentils, tofu, and grains, such as rice.  Drink plenty of water daily.  Decrease saturated fat in the diet, avoid lots of red meat, processed foods, sweets, fast foods, and fried foods.  Exercise:  Aerobic exercise helps maintain good heart health. At least 30-40 minutes of moderate-intensity exercise is recommended. For example, a brisk walk that increases your heart rate and breathing. This should be done on most days of the week.   Find a type of exercise or a variety of exercises that you enjoy so that it becomes a part of your daily life.  Examples are running, walking, swimming, water aerobics, and biking.  For motivation and support, explore group exercise such as aerobic class, spin class, Zumba, Yoga,or  martial arts, etc.    Set exercise goals for yourself, such as a certain weight goal, walk or run in a race such as a 5k walk/run.  Speak to your primary care provider about exercise goals.  Disease prevention:  If you smoke or chew tobacco, find out from your caregiver how to quit. It can literally save your life, no matter how long you have been a tobacco user. If you do not use tobacco, never begin.   Maintain a healthy diet and normal weight. Increased weight leads to problems with  blood pressure and diabetes.   The Body Mass Index or BMI is a way of measuring how much of your body is fat. Having a BMI above 27 increases the risk of heart disease, diabetes, hypertension, stroke and other problems related to obesity. Your caregiver can help determine your BMI and based on it develop an exercise and dietary program to help you achieve or maintain this important measurement at a healthful  level.  High blood pressure causes heart and blood vessel problems.  Persistent high blood pressure should be treated with medicine if weight loss and exercise do not work.   Fat and cholesterol leaves deposits in your arteries that can block them. This causes heart disease and vessel disease elsewhere in your body.  If your cholesterol is found to be high, or if you have heart disease or certain other medical conditions, then you may need to have your cholesterol monitored frequently and be treated with medication.   Ask if you should have a cardiac stress test if your history suggests this. A stress test is a test done on a treadmill that looks for heart disease. This test can find disease prior to there being a problem.  Osteoporosis is a disease in which the bones lose minerals and strength as we age. This can result in serious bone fractures. Risk of osteoporosis can be identified using a bone density scan. Men ages 65 and over should discuss this with their caregivers. Ask your caregiver whether you should be taking a calcium supplement and Vitamin D, to reduce the rate of osteoporosis.   Avoid drinking alcohol in excess (more than two drinks per day).  Avoid use of street drugs. Do not share needles with anyone. Ask for professional help if you need assistance or instructions on stopping the use of alcohol, cigarettes, and/or drugs.  Brush your teeth twice a day with fluoride toothpaste, and floss once a day. Good oral hygiene prevents tooth decay and gum disease. The problems can be painful, unattractive, and can cause other health problems. Visit your dentist for a routine oral and dental check up and preventive care every 6-12 months.   Look at your skin regularly.  Use a mirror to look at your back. Notify your caregivers of changes in moles, especially if there are changes in shapes, colors, a size larger than a pencil eraser, an irregular border, or development of new  moles.  Safety:  Use seatbelts 100% of the time, whether driving or as a passenger.  Use safety devices such as hearing protection if you work in environments with loud noise or significant background noise.  Use safety glasses when doing any work that could send debris in to the eyes.  Use a helmet if you ride a bike or motorcycle.  Use appropriate safety gear for contact sports.  Talk to your caregiver about gun safety.  Use sunscreen with a SPF (or skin protection factor) of 15 or greater.  Lighter skinned people are at a greater risk of skin cancer. Don't forget to also wear sunglasses in order to protect your eyes from too much damaging sunlight. Damaging sunlight can accelerate cataract formation.   Practice safe sex. Use condoms. Condoms are used for birth control and to help reduce the spread of sexually transmitted infections (or STIs).  Some of the STIs are gonorrhea (the clap), chlamydia, syphilis, trichomonas, herpes, HPV (human papilloma virus) and HIV (human immunodeficiency virus) which causes AIDS. The herpes, HIV and HPV are viral illnesses that  have no cure. These can result in disability, cancer and death.   Keep carbon monoxide and smoke detectors in your home functioning at all times. Change the batteries every 6 months or use a model that plugs into the wall.   Vaccinations:  Stay up to date with your tetanus shots and other required immunizations. You should have a booster for tetanus every 10 years. Be sure to get your flu shot every year, since 5%-20% of the [place] population comes down with the flu. The flu vaccine changes each year, so being vaccinated once is not enough. Get your shot in the fall, before the flu season peaks.   Other vaccines to consider:  Human Papilloma Virus or HPV causes cancer of the cervix, and other infections that can be transmitted from person to person. There is a vaccine for HPV, and males should get immunized between the ages of 29 and 69. It  requires a series of 3 shots.   Pneumococcal vaccine to protect against certain types of pneumonia.  This is normally recommended for adults age 59 or older.  However, adults younger than 37 years old with certain underlying conditions such as diabetes, heart or lung disease should also receive the vaccine.  Shingles vaccine to protect against Varicella Zoster if you are older than age 91, or younger than 37 years old with certain underlying illness.  If you have not had the Shingrix vaccine, please call your insurer to inquire about coverage for the Shingrix vaccine given in 2 doses.   Some insurers cover this vaccine after age 18, some cover this after age 3.  If your insurer covers this, then call to schedule appointment to have this vaccine here  Hepatitis A vaccine to protect against a form of infection of the liver by a virus acquired from food.  Hepatitis B vaccine to protect against a form of infection of the liver by a virus acquired from blood or body fluids, particularly if you work in health care.  If you plan to travel internationally, check with your local health department for specific vaccination recommendations.   What should I know about cancer screening? Many types of cancers can be detected early and may often be prevented. Lung Cancer  You should be screened every year for lung cancer if: ? You are a current smoker who has smoked for at least 30 years. ? You are a former smoker who has quit within the past 15 years.  Talk to your health care provider about your screening options, when you should start screening, and how often you should be screened.  Colorectal Cancer  Routine colorectal cancer screening usually begins at 37 years of age and should be repeated every 5-10 years until you are 37 years old. You may need to be screened more often if early forms of precancerous polyps or small growths are found. Your health care provider may recommend screening at an earlier  age if you have risk factors for colon cancer.  Your health care provider may recommend using home test kits to check for hidden blood in the stool.  A small camera at the end of a tube can be used to examine your colon (sigmoidoscopy or colonoscopy). This checks for the earliest forms of colorectal cancer.  Prostate and Testicular Cancer  Depending on your age and overall health, your health care provider may do certain tests to screen for prostate and testicular cancer.  Talk to your health care provider about any symptoms or  concerns you have about testicular or prostate cancer.  Skin Cancer  Check your skin from head to toe regularly.  Tell your health care provider about any new moles or changes in moles, especially if: ? There is a change in a mole's size, shape, or color. ? You have a mole that is larger than a pencil eraser.  Always use sunscreen. Apply sunscreen liberally and repeat throughout the day.  Protect yourself by wearing long sleeves, pants, a wide-brimmed hat, and sunglasses when outside.

## 2018-03-18 NOTE — Progress Notes (Signed)
Subjective:   HPI  Wesley Cruz is a 37 y.o. male who presents for Chief Complaint  Patient presents with  . Annual Exam    Medical care team includes: Marquerite Forsman, Camelia Eng, PA-C here for primary care Dentist Eye doctor  Concerns: Celebrating a year of sobriety.  He and his family including 37yo and 37yo just went to [place].   He was able to rent a car for the first time on vacation absent alcohol.  Reviewed their medical, surgical, family, social, medication, and allergy history and updated chart as appropriate.  Past Medical History:  Diagnosis Date  . Allergy   . Anxiety   . Bee sting-induced anaphylaxis 2013  . History of alcohol abuse    went through residential treatment program 2018  . Hyperlipidemia 6/15  . Hypertension 6/15  . Laceration 2009   right hand  . Seizure ([place]) 2009   questionable seizure, syncope, one episode, none since - seen at [facility] ED  . Smoker   . Tobacco use     Past Surgical History:  Procedure Laterality Date  . LACERATION REPAIR  2009   right dorsal hand    Social History   Socioeconomic History  . Marital status: Married    Spouse name: Not on file  . Number of children: Not on file  . Years of education: Not on file  . Highest education level: Not on file  Occupational History  . Not on file  Social Needs  . Financial resource strain: Not on file  . Food insecurity:    Worry: Not on file    Inability: Not on file  . Transportation needs:    Medical: Not on file    Non-medical: Not on file  Tobacco Use  . Smoking status: Current Every Day Smoker    Packs/day: 0.50    Years: 12.00    Pack years: 6.00    Types: Cigarettes  . Smokeless tobacco: Never Used  Substance and Sexual Activity  . Alcohol use: No    Comment: significant in past, went through recovery program 2018  . Drug use: No  . Sexual activity: Not on file  Lifestyle  . Physical activity:    Days per week: Not on file    Minutes per session: Not on  file  . Stress: Not on file  Relationships  . Social connections:    Talks on phone: Not on file    Gets together: Not on file    Attends religious service: Not on file    Active member of club or organization: Not on file    Attends meetings of clubs or organizations: Not on file    Relationship status: Not on file  . Intimate partner violence:    Fear of current or ex partner: Not on file    Emotionally abused: Not on file    Physically abused: Not on file    Forced sexual activity: Not on file  Other Topics Concern  . Not on file  Social History Narrative   Married, has dogs, 2 children, exercise - walking.  Christian.      Family History  Problem Relation Age of Onset  . Hypertension Father   . Hypertension Paternal Uncle   . Stroke Paternal Uncle      Current Outpatient Medications:  .  EPINEPHrine (EPIPEN 2-PAK) 0.3 mg/0.3 mL IJ SOAJ injection, Inject 0.3 mLs (0.3 mg total) into the muscle once as needed (for severe allergic reaction). CAll 911  immediately if you have to use this medicine, Disp: 1 Device, Rfl: 1 .  hydrochlorothiazide (HYDRODIURIL) 25 MG tablet, TAKE 1 TABLET(25 MG) BY MOUTH DAILY, Disp: 90 tablet, Rfl: 0 .  omega-3 acid ethyl esters (LOVAZA) 1 g capsule, TAKE 2 CAPSULES BY MOUTH TWICE DAILY, Disp: 60 capsule, Rfl: 0 .  rosuvastatin (CRESTOR) 40 MG tablet, TAKE 1 TABLET(40 MG) BY MOUTH DAILY, Disp: 90 tablet, Rfl: 0  Allergies  Allergen Reactions  . Hornet Venom Anaphylaxis    Japanese hornet  . Lisinopril     cough     Review of Systems Constitutional: -fever, -chills, -sweats, -unexpected weight change, -decreased appetite, -fatigue Allergy: -sneezing, -itching, -congestion Dermatology: -changing moles, --rash, -lumps ENT: -runny nose, -ear pain, -sore throat, -hoarseness, -sinus pain, -teeth pain, - ringing in ears, -hearing loss, -nosebleeds Cardiology: -chest pain, -palpitations, -swelling, -difficulty breathing when lying flat, -waking up  short of breath Respiratory: -cough, -shortness of breath, -difficulty breathing with exercise or exertion, -wheezing, -coughing up blood Gastroenterology: -abdominal pain, -nausea, -vomiting, -diarrhea, -constipation, -blood in stool, -changes in bowel movement, -difficulty swallowing or eating Hematology: -bleeding, -bruising  Musculoskeletal: -joint aches, -muscle aches, -joint swelling, -back pain, -neck pain, -cramping, -changes in gait Ophthalmology: denies vision changes, eye redness, itching, discharge Urology: -burning with urination, -difficulty urinating, -blood in urine, -urinary frequency, -urgency, -incontinence Neurology: -headache, -weakness, -tingling, -numbness, -memory loss, -falls, -dizziness Psychology: -depressed mood, -agitation, -sleep problems Male GU: no testicular mass, pain, no lymph nodes swollen, no swelling, no rash.     Objective:  BP 130/86   Pulse 83   Temp 98.2 F (36.8 C) (Oral)   Ht 6' 2.75" (1.899 m)   Wt 254 lb 12.8 oz (115.6 kg)   SpO2 97%   BMI 32.06 kg/m   Wt Readings from Last 3 Encounters:  03/18/18 254 lb 12.8 oz (115.6 kg)  12/04/17 253 lb 12.8 oz (115.1 kg)  07/23/17 250 lb 9.6 oz (113.7 kg)   General appearance: alert, no distress, WD/WN, Caucasian male Skin: unremarkable HEENT: normocephalic, conjunctiva/corneas normal, sclerae anicteric, PERRLA, EOMi, nares patent, no discharge or erythema, pharynx normal Oral cavity: MMM, tongue normal, teeth normal Neck: supple, no lymphadenopathy, no thyromegaly, no masses, normal ROM, no bruits Chest: non tender, normal shape and expansion Heart: RRR, normal S1, S2, no murmurs Lungs: CTA bilaterally, no wheezes, rhonchi, or rales Abdomen: +bs, soft, non tender, non distended, no masses, no hepatomegaly, no splenomegaly, no bruits Back: non tender, normal ROM, no scoliosis Musculoskeletal: upper extremities non tender, no obvious deformity, normal ROM throughout, lower extremities non tender,  no obvious deformity, normal ROM throughout Extremities: no edema, no cyanosis, no clubbing Pulses: 2+ symmetric, upper and lower extremities, normal cap refill Neurological: alert, oriented x 3, CN2-12 intact, strength normal upper extremities and lower extremities, sensation normal throughout, DTRs 2+ throughout, no cerebellar signs, gait normal Psychiatric: normal affect, behavior normal, pleasant  GU: normal male external genitalia,circumcised, nontender, no masses, no hernia, no lymphadenopathy Rectal: deferred   Assessment and Plan :   Encounter Diagnoses  Name Primary?  . Encounter for health maintenance examination in adult Yes  . Essential hypertension   . Fatty liver   . Psoriasis   . Hyperlipidemia, unspecified hyperlipidemia type   . Smoker   . Recovering alcoholic ([place])   . History of alcohol abuse   . Elevated LFTs   . Attention deficit disorder, unspecified hyperactivity presence   . Overweight (BMI 25.0-29.9)   . Vitamin D deficiency   .  Vaccine counseling     Physical exam - discussed and counseled on healthy lifestyle, diet, exercise, preventative care, vaccinations, sick and well care, proper use of emergency dept and after hours care, and addressed their concerns.    Health screening: See your eye doctor yearly for routine vision care. See your dentist yearly for routine dental care including hygiene visits twice yearly.  Cancer screening Advised monthly self testicular exam  Colonoscopy:  Age 5yo  Discussed PSA, prostate exam, and prostate cancer screening risks/benefits.     Vaccinations: Advised yearly influenza vaccine  Counseled on Pneumococcal 23 vaccine Patient declines pneumococcal vaccine  Patient is up to date on Td vaccine  Acute issues discussed: none  Separate significant chronic issues discussed: Obesity - c/t efforts at weight loss, exercise  Glad to see he is still sober a year out!  Refills will need to be 90 day  supply   Fines was seen today for annual exam.  Diagnoses and all orders for this visit:  Encounter for health maintenance examination in adult -     Comprehensive metabolic panel -     CBC -     Hemoglobin A1c -     Lipid panel -     VITAMIN D 25 Hydroxy (Vit-D Deficiency, Fractures)  Essential hypertension  Fatty liver  Psoriasis  Hyperlipidemia, unspecified hyperlipidemia type  Smoker  Recovering alcoholic ([place])  History of alcohol abuse  Elevated LFTs  Attention deficit disorder, unspecified hyperactivity presence  Overweight (BMI 25.0-29.9)  Vitamin D deficiency  Vaccine counseling   Follow-up pending labs, yearly for physical

## 2018-03-18 NOTE — Addendum Note (Signed)
Addended by: Edgar Frisk on: 03/18/2018 09:30 AM   Modules accepted: Orders

## 2018-03-19 ENCOUNTER — Other Ambulatory Visit: Payer: Self-pay | Admitting: Medical

## 2018-03-19 DIAGNOSIS — R779 Abnormality of plasma protein, unspecified: Secondary | ICD-10-CM

## 2018-03-19 DIAGNOSIS — E8809 Other disorders of plasma-protein metabolism, not elsewhere classified: Secondary | ICD-10-CM

## 2018-03-19 LAB — COMPREHENSIVE METABOLIC PANEL
A/G RATIO: 2.5 — AB (ref 1.2–2.2)
ALBUMIN: 5.4 g/dL (ref 3.5–5.5)
ALT: 59 IU/L — ABNORMAL HIGH (ref 0–44)
AST: 29 IU/L (ref 0–40)
Alkaline Phosphatase: 85 IU/L (ref 39–117)
BUN / CREAT RATIO: 13 (ref 9–20)
BUN: 10 mg/dL (ref 6–20)
Bilirubin Total: 0.8 mg/dL (ref 0.0–1.2)
CO2: 24 mmol/L (ref 20–29)
Calcium: 9.9 mg/dL (ref 8.7–10.2)
Chloride: 99 mmol/L (ref 96–106)
Creatinine, Ser: 0.75 mg/dL — ABNORMAL LOW (ref 0.76–1.27)
GFR calc Af Amer: 136 mL/min/{1.73_m2} (ref >59)
GFR, EST NON AFRICAN AMERICAN: 118 mL/min/{1.73_m2} (ref >59)
GLOBULIN, TOTAL: 2.2 g/dL (ref 1.5–4.5)
Glucose: 92 mg/dL (ref 65–99)
POTASSIUM: 4.4 mmol/L (ref 3.5–5.2)
SODIUM: 141 mmol/L (ref 134–144)
Total Protein: 7.6 g/dL (ref 6.0–8.5)

## 2018-03-19 LAB — CBC
Hematocrit: 48.1 % (ref 37.5–51.0)
Hemoglobin: 16.1 g/dL (ref 13.0–17.7)
MCH: 31.1 pg (ref 26.6–33.0)
MCHC: 33.5 g/dL (ref 31.5–35.7)
MCV: 93 fL (ref 79–97)
PLATELETS: 249 10*3/uL (ref 150–450)
RBC: 5.17 x10E6/uL (ref 4.14–5.80)
RDW: 13.3 % (ref 12.3–15.4)
WBC: 9.6 10*3/uL (ref 3.4–10.8)

## 2018-03-19 LAB — LIPID PANEL
CHOL/HDL RATIO: 3.3 ratio (ref 0.0–5.0)
Cholesterol, Total: 115 mg/dL (ref 100–199)
HDL: 35 mg/dL — ABNORMAL LOW (ref >39)
LDL CALC: 63 mg/dL (ref 0–99)
TRIGLYCERIDES: 87 mg/dL (ref 0–149)
VLDL Cholesterol Cal: 17 mg/dL (ref 5–40)

## 2018-03-19 LAB — VITAMIN D 25 HYDROXY (VIT D DEFICIENCY, FRACTURES): Vit D, 25-Hydroxy: 46 ng/mL (ref 30.0–100.0)

## 2018-03-19 LAB — HEMOGLOBIN A1C
Est. average glucose Bld gHb Est-mCnc: 103 mg/dL
HEMOGLOBIN A1C: 5.2 % (ref 4.8–5.6)

## 2018-03-19 MED ORDER — OMEGA-3-ACID ETHYL ESTERS 1 G PO CAPS: 2.0000 | ORAL_CAPSULE | Freq: Two times a day (BID) | ORAL | 3 refills | Status: DC

## 2018-03-19 MED ORDER — ROSUVASTATIN CALCIUM 40 MG PO TABS: ORAL_TABLET | ORAL | 3 refills | Status: DC

## 2018-03-19 MED ORDER — HYDROCHLOROTHIAZIDE 25 MG PO TABS: ORAL_TABLET | ORAL | 3 refills | Status: DC

## 2018-04-23 ENCOUNTER — Other Ambulatory Visit: Payer: Self-pay | Admitting: Medical

## 2018-04-24 ENCOUNTER — Other Ambulatory Visit: Payer: Self-pay | Admitting: Medical

## 2018-04-25 NOTE — Telephone Encounter (Signed)
Is this okay to refill? 

## 2018-04-28 ENCOUNTER — Other Ambulatory Visit: Payer: Self-pay | Admitting: Medical

## 2018-06-10 ENCOUNTER — Other Ambulatory Visit: Payer: Self-pay | Admitting: Medical

## 2018-06-19 ENCOUNTER — Ambulatory Visit: Payer: Self-pay | Admitting: Medical

## 2018-06-20 ENCOUNTER — Ambulatory Visit: Payer: 59 | Admitting: Medical

## 2018-06-20 VITALS — BP 120/70 | HR 76 | Temp 97.8°F | Resp 16 | Ht 76.0 in | Wt 252.4 lb

## 2018-06-20 DIAGNOSIS — E785 Hyperlipidemia, unspecified: Secondary | ICD-10-CM

## 2018-06-20 DIAGNOSIS — F1021 Alcohol dependence, in remission: Secondary | ICD-10-CM

## 2018-06-20 DIAGNOSIS — R945 Abnormal results of liver function studies: Secondary | ICD-10-CM

## 2018-06-20 DIAGNOSIS — Z87898 Personal history of other specified conditions: Secondary | ICD-10-CM

## 2018-06-20 DIAGNOSIS — K760 Fatty (change of) liver, not elsewhere classified: Secondary | ICD-10-CM | POA: Diagnosis not present

## 2018-06-20 DIAGNOSIS — R771 Abnormality of globulin: Secondary | ICD-10-CM

## 2018-06-20 DIAGNOSIS — Z2821 Immunization not carried out because of patient refusal: Secondary | ICD-10-CM

## 2018-06-20 DIAGNOSIS — F1011 Alcohol abuse, in remission: Secondary | ICD-10-CM

## 2018-06-20 DIAGNOSIS — I10 Essential (primary) hypertension: Secondary | ICD-10-CM

## 2018-06-20 DIAGNOSIS — K649 Unspecified hemorrhoids: Secondary | ICD-10-CM | POA: Insufficient documentation

## 2018-06-20 DIAGNOSIS — K644 Residual hemorrhoidal skin tags: Secondary | ICD-10-CM

## 2018-06-20 DIAGNOSIS — E559 Vitamin D deficiency, unspecified: Secondary | ICD-10-CM

## 2018-06-20 DIAGNOSIS — R7989 Other specified abnormal findings of blood chemistry: Secondary | ICD-10-CM

## 2018-06-20 LAB — COMPREHENSIVE METABOLIC PANEL
ALBUMIN: 5.2 g/dL (ref 3.5–5.5)
ALK PHOS: 77 IU/L (ref 39–117)
ALT: 46 IU/L — AB (ref 0–44)
AST: 26 IU/L (ref 0–40)
Albumin/Globulin Ratio: 2.6 — ABNORMAL HIGH (ref 1.2–2.2)
BUN / CREAT RATIO: 15 (ref 9–20)
BUN: 12 mg/dL (ref 6–20)
Bilirubin Total: 0.7 mg/dL (ref 0.0–1.2)
CO2: 23 mmol/L (ref 20–29)
CREATININE: 0.81 mg/dL (ref 0.76–1.27)
Calcium: 9.9 mg/dL (ref 8.7–10.2)
Chloride: 98 mmol/L (ref 96–106)
GFR calc Af Amer: 132 mL/min/{1.73_m2} (ref >59)
GFR calc non Af Amer: 114 mL/min/{1.73_m2} (ref >59)
GLUCOSE: 99 mg/dL (ref 65–99)
Globulin, Total: 2 g/dL (ref 1.5–4.5)
Potassium: 4.5 mmol/L (ref 3.5–5.2)
Sodium: 138 mmol/L (ref 134–144)
TOTAL PROTEIN: 7.2 g/dL (ref 6.0–8.5)

## 2018-06-20 MED ORDER — VITAMIN D 1000 UNITS PO TABS: 1000.0000 [IU] | ORAL_TABLET | Freq: Every day | ORAL | 11 refills | Status: DC

## 2018-06-20 MED ORDER — HYDROCORTISONE 2.5 % RE CREA: 1.0000 "application " | TOPICAL_CREAM | Freq: Two times a day (BID) | RECTAL | 0 refills | Status: DC

## 2018-06-20 MED ORDER — OMEGA-3-ACID ETHYL ESTERS 1 G PO CAPS: 2.0000 | ORAL_CAPSULE | Freq: Two times a day (BID) | ORAL | 10 refills | Status: DC

## 2018-06-20 MED ORDER — ROSUVASTATIN CALCIUM 40 MG PO TABS: ORAL_TABLET | ORAL | 10 refills | Status: DC

## 2018-06-20 MED ORDER — HYDROCHLOROTHIAZIDE 25 MG PO TABS: ORAL_TABLET | ORAL | 10 refills | Status: DC

## 2018-06-20 NOTE — Progress Notes (Signed)
Subjective: Chief Complaint  Patient presents with  . follow up    follow up protein globin   Here for follow-up from his last visit and elevated protein and liver test  He has a history of hypertension, fatty liver disease, elevated liver function test, vitamin D deficiency, history of alcohol abuse.  At his most recent couple visits he has been sober and has been doing really well, has made big-time lifestyle changes for the better.  Doing [employer] [facility] [employer] home group, still attending [place] group, goes to [employer].   Volunteering at [facility] and jail monthly.    Feels a small ball at the anus, thinks this is a hemorrhoid that won't resolve.   Gets rectal irritation after bowel movements.  Not painful.    There is issue with 90 vs 30 days refills.  He is getting conflicting stories from his pharmacy and insurer about which is preferred.  He will check again with insurer.  For now, wants 30 day supply sent.    Past Medical History:  Diagnosis Date  . Allergy   . Anxiety   . Bee sting-induced anaphylaxis 2013  . History of alcohol abuse    went through residential treatment program 2018  . Hyperlipidemia 6/15  . Hypertension 6/15  . Laceration 2009   right hand  . Seizure ([place]) 2009   questionable seizure, syncope, one episode, none since - seen at [facility] ED  . Smoker   . Tobacco use    Current Outpatient Medications on File Prior to Visit  Medication Sig Dispense Refill  . EPINEPHrine (EPIPEN 2-PAK) 0.3 mg/0.3 mL IJ SOAJ injection Inject 0.3 mLs (0.3 mg total) into the muscle once as needed (for severe allergic reaction). CAll 911 immediately if you have to use this medicine 1 Device 1   No current facility-administered medications on file prior to visit.    ROS as in subjective   Objective: BP 120/70   Pulse 76   Temp 97.8 F (36.6 C) (Oral)   Resp 16   Ht 6\' 4"  (1.93 m)   Wt 252 lb 6.4 oz (114.5 kg)   SpO2 96%   BMI 30.72 kg/m   Wt Readings  from Last 3 Encounters:  06/20/18 252 lb 6.4 oz (114.5 kg)  03/18/18 254 lb 12.8 oz (115.6 kg)  12/04/17 253 lb 12.8 oz (115.1 kg)   Gen: wd, wn, nad Rectal - mild to medium non thrombosed nontender hemorrhoid left side of anus   Assessment Encounter Diagnoses  Name Primary?  . Essential hypertension Yes  . Fatty liver   . Elevated LFTs   . History of alcohol abuse   . Hyperlipidemia, unspecified hyperlipidemia type   . Recovering alcoholic ([place])   . Vitamin D deficiency   . External hemorrhoid   . Influenza vaccination declined       Plan: Abnormal labs from last visit-repeat labs today  Elevated liver test- prior due to alcohol abuse and fatty liver disease.  Liver tests have been stable of late.  Abdominal ultrasound from November 2017 showed hepatomegaly and fatty liver disease.  Negative hepatitis A, B, C back in October 2017.Marland Kitchen  Hypertension- continue current medication hydrochlorothiazide 25 mg daily  Hyperlipidemia, fatty liver disease-continue Lovaza and Crestor 40 mg daily, healthy diet and exercise, plan repeat liver ultrasound next physical   History of vitamin D deficiency-advised to see begin 1000 units daily vitamin D over-the-counter, eat seafood and fish at least 1 or 2 times  per week, and he completed the 50,000 weekly vitamin D from last visit.  History of alcohol abuse-remains sober, attending AA meetings  declines flu shot  Hemorrhoid - cream as below prn 3-5 days, hot bath soaks, and if not resolving soon, come in for hemorrhoid procedure.   Renso was seen today for follow up.  Diagnoses and all orders for this visit:  Essential hypertension  Fatty liver -     Comprehensive metabolic panel  Elevated LFTs -     Comprehensive metabolic panel  History of alcohol abuse  Hyperlipidemia, unspecified hyperlipidemia type  Recovering alcoholic ([place])  Vitamin D deficiency  External hemorrhoid  Influenza vaccination declined  Other orders -      rosuvastatin (CRESTOR) 40 MG tablet; TAKE 1 TABLET(40 MG) BY MOUTH DAILY -     hydrochlorothiazide (HYDRODIURIL) 25 MG tablet; TAKE 1 TABLET(25 MG) BY MOUTH DAILY -     omega-3 acid ethyl esters (LOVAZA) 1 g capsule; Take 2 capsules (2 g total) by mouth 2 (two) times daily. -     cholecalciferol (VITAMIN D) 1000 units tablet; Take 1 tablet (1,000 Units total) by mouth daily. -     hydrocortisone (PROCTOSOL HC) 2.5 % rectal cream; Place 1 application rectally 2 (two) times daily.

## 2018-06-25 DIAGNOSIS — R771 Abnormality of globulin: Secondary | ICD-10-CM | POA: Insufficient documentation

## 2018-06-25 LAB — PROTEIN ELECTROPHORESIS
A/G RATIO SPE: 1.7 (ref 0.7–1.7)
ALBUMIN ELP: 4.4 g/dL (ref 2.9–4.4)
Alpha 1: 0.2 g/dL (ref 0.0–0.4)
Alpha 2: 0.7 g/dL (ref 0.4–1.0)
Beta: 0.9 g/dL (ref 0.7–1.3)
GAMMA GLOBULIN: 0.8 g/dL (ref 0.4–1.8)
GLOBULIN, TOTAL: 2.6 g/dL (ref 2.2–3.9)
Total Protein: 7 g/dL (ref 6.0–8.5)

## 2018-06-25 LAB — SPECIMEN STATUS REPORT

## 2018-11-10 ENCOUNTER — Encounter: Payer: Self-pay | Admitting: Medical

## 2018-11-10 ENCOUNTER — Ambulatory Visit: Payer: 59 | Admitting: Medical

## 2018-11-10 VITALS — BP 130/84 | HR 76 | Temp 98.1°F | Resp 16 | Ht 76.0 in | Wt 255.6 lb

## 2018-11-10 DIAGNOSIS — Z20828 Contact with and (suspected) exposure to other viral communicable diseases: Secondary | ICD-10-CM | POA: Diagnosis not present

## 2018-11-10 DIAGNOSIS — J0100 Acute maxillary sinusitis, unspecified: Secondary | ICD-10-CM

## 2018-11-10 MED ORDER — OSELTAMIVIR PHOSPHATE 75 MG PO CAPS: 75.0000 mg | ORAL_CAPSULE | Freq: Two times a day (BID) | ORAL | 0 refills | Status: DC

## 2018-11-10 MED ORDER — AMOXICILLIN 875 MG PO TABS: 875.0000 mg | ORAL_TABLET | Freq: Two times a day (BID) | ORAL | 0 refills | Status: DC

## 2018-11-10 NOTE — Progress Notes (Signed)
Subjective:  Santiel Topper is a 38 y.o. male who presents for  Chief Complaint  Patient presents with  . sinus infection    drainage, ear popping, X Wednesday   Son has flu   Here for sinus infection.  Son has the flu.   He notes feeling bad since last 6 days ago.  Symptoms include low energy, decreased appetite, ears popping a lot, discharge from nose, sinus pressure, not feeling well.  Has had low grade temp this past few days. Face is sore.   Denies NVD, no cough. Using theraflu and ibuprofen, vitamin C, elderberry for symptoms. No other aggravating or relieving factors.  No other complaint.    Past Medical History:  Diagnosis Date  . Allergy   . Anxiety   . Bee sting-induced anaphylaxis 2013  . History of alcohol abuse    went through residential treatment program 2018  . Hyperlipidemia 6/15  . Hypertension 6/15  . Laceration 2009   right hand  . Seizure ([place]) 2009   questionable seizure, syncope, one episode, none since - seen at [facility] ED  . Smoker   . Tobacco use     Current Outpatient Medications on File Prior to Visit  Medication Sig Dispense Refill  . cholecalciferol (VITAMIN D) 1000 units tablet Take 1 tablet (1,000 Units total) by mouth daily. 30 tablet 11  . EPINEPHrine (EPIPEN 2-PAK) 0.3 mg/0.3 mL IJ SOAJ injection Inject 0.3 mLs (0.3 mg total) into the muscle once as needed (for severe allergic reaction). CAll 911 immediately if you have to use this medicine 1 Device 1  . hydrochlorothiazide (HYDRODIURIL) 25 MG tablet TAKE 1 TABLET(25 MG) BY MOUTH DAILY 30 tablet 10  . omega-3 acid ethyl esters (LOVAZA) 1 g capsule Take 2 capsules (2 g total) by mouth 2 (two) times daily. 120 capsule 10  . rosuvastatin (CRESTOR) 40 MG tablet TAKE 1 TABLET(40 MG) BY MOUTH DAILY 30 tablet 10  . hydrocortisone (PROCTOSOL HC) 2.5 % rectal cream Place 1 application rectally 2 (two) times daily. (Patient not taking: Reported on 11/10/2018) 30 g 0   No current facility-administered  medications on file prior to visit.     ROS as in subjective   Objective: BP 130/84   Pulse 76   Temp 98.1 F (36.7 C) (Oral)   Resp 16   Ht 6\' 4"  (1.93 m)   Wt 255 lb 9.6 oz (115.9 kg)   SpO2 96%   BMI 31.11 kg/m   Wt Readings from Last 3 Encounters:  11/10/18 255 lb 9.6 oz (115.9 kg)  06/20/18 252 lb 6.4 oz (114.5 kg)  03/18/18 254 lb 12.8 oz (115.6 kg)   General appearance: Alert, well developed, well nourished, no distress                             Skin: warm, no rash                           Head: +mild maxillary sinus tenderness,                            Eyes: conjunctiva pink, corneas clear                            Ears: flat left tympanic membrane, flat right tympanic membrane, external ear  canals normal                          Nose: septum midline, turbinates swollen, with erythema and clear discharge             Mouth/throat: MMM, tongue normal, mild pharyngeal erythema                           Neck: supple, no adenopathy, no thyromegaly, non tender                         Lungs: clear, no wheezes, no rales, no rhonchi        Assessment  Encounter Diagnoses  Name Primary?  . Acute non-recurrent maxillary sinusitis Yes  . Exposure to the flu       Plan: Discussed diagnosis of sinusitis.   Discussed usual time frame to see improvement. Discussed possible complications or symptoms that would prompt call back or recheck within the next few days.     Medications prescribed:  Complete the course of amoxicillin antibiotic prescribed today.  Begin Tamilfu for prophylaxis.  discussed proper use of medications, risks/benefits.   Begin mucinex for mucous.  Can use nasal saline flush, rest, hydrate well, and f/lu if not improving in the next 3-4 days.  Patient was advised to call or return if worse or not improving in the next few days.    Patient voiced understanding of diagnosis, recommendations, and treatment plan.   Zarif was seen today for sinus  infection.  Diagnoses and all orders for this visit:  Acute non-recurrent maxillary sinusitis  Exposure to the flu  Other orders -     oseltamivir (TAMIFLU) 75 MG capsule; Take 1 capsule (75 mg total) by mouth 2 (two) times daily. -     amoxicillin (AMOXIL) 875 MG tablet; Take 1 tablet (875 mg total) by mouth 2 (two) times daily.

## 2018-12-26 DIAGNOSIS — L819 Disorder of pigmentation, unspecified: Secondary | ICD-10-CM | POA: Diagnosis not present

## 2018-12-26 DIAGNOSIS — D1801 Hemangioma of skin and subcutaneous tissue: Secondary | ICD-10-CM | POA: Diagnosis not present

## 2018-12-26 DIAGNOSIS — D485 Neoplasm of uncertain behavior of skin: Secondary | ICD-10-CM | POA: Diagnosis not present

## 2019-01-12 ENCOUNTER — Telehealth (INDEPENDENT_AMBULATORY_CARE_PROVIDER_SITE_OTHER): Payer: 59 | Admitting: Medical

## 2019-01-12 ENCOUNTER — Other Ambulatory Visit: Payer: Self-pay

## 2019-01-12 DIAGNOSIS — B356 Tinea cruris: Secondary | ICD-10-CM

## 2019-01-12 DIAGNOSIS — R21 Rash and other nonspecific skin eruption: Secondary | ICD-10-CM | POA: Diagnosis not present

## 2019-01-12 DIAGNOSIS — L299 Pruritus, unspecified: Secondary | ICD-10-CM | POA: Diagnosis not present

## 2019-01-12 MED ORDER — FLUCONAZOLE 150 MG PO TABS: ORAL_TABLET | ORAL | 1 refills | Status: DC

## 2019-01-12 NOTE — Progress Notes (Signed)
Documentation for Telephone encounter:  This telephone service is not related to other E/M service within previous 7 days.  Patient consented to the consult.  This telephone consult involved patient and myself, Dorothea Ogle PA-C.  Subjective: Concerned about yeast infection.   No hx/o yeast infection.   Few months ago after vasectomy, started with severe itching.  Was initially seen in follow up by urology.  Was diagnosed with yeast infection on scrotum skin and inside upper thigh.    Current reports redness on inside of thigh and scrotum, identical to what he saw a few months.   Denies lots of sweating on inside of thigh, but is active on the job, moves around a lot.  No excessive sweating.  No fever.  No urinary frequency, no burning with urination, no blood in urine.   No penile discharge.      Objective: Gen: sounds well over the phone, nad   Assessment: Encounter Diagnoses  Name Primary?  . Tinea cruris Yes  . Rash   . Itching      Plan: Begin diflucan oral, which is what urology used a few months ago for same.  Can also use topical OTC antifungal cream.   advised he keep the genital region dry, use boxers instead of briefs.    They were advised to follow up if not improving in the next 2-3 weeks  Time involving medical discussion was 8 minutes.

## 2019-03-24 ENCOUNTER — Other Ambulatory Visit: Payer: Self-pay

## 2019-03-24 ENCOUNTER — Encounter: Payer: Self-pay | Admitting: Medical

## 2019-03-24 ENCOUNTER — Ambulatory Visit (INDEPENDENT_AMBULATORY_CARE_PROVIDER_SITE_OTHER): Payer: 59 | Admitting: Medical

## 2019-03-24 VITALS — BP 130/84 | HR 72 | Temp 98.3°F | Resp 16 | Ht 75.0 in | Wt 248.8 lb

## 2019-03-24 DIAGNOSIS — E559 Vitamin D deficiency, unspecified: Secondary | ICD-10-CM

## 2019-03-24 DIAGNOSIS — E785 Hyperlipidemia, unspecified: Secondary | ICD-10-CM

## 2019-03-24 DIAGNOSIS — Z0000 Encounter for general adult medical examination without abnormal findings: Secondary | ICD-10-CM | POA: Diagnosis not present

## 2019-03-24 DIAGNOSIS — I10 Essential (primary) hypertension: Secondary | ICD-10-CM

## 2019-03-24 DIAGNOSIS — F1021 Alcohol dependence, in remission: Secondary | ICD-10-CM

## 2019-03-24 DIAGNOSIS — Z7189 Other specified counseling: Secondary | ICD-10-CM

## 2019-03-24 DIAGNOSIS — Z23 Encounter for immunization: Secondary | ICD-10-CM | POA: Diagnosis not present

## 2019-03-24 DIAGNOSIS — F1011 Alcohol abuse, in remission: Secondary | ICD-10-CM

## 2019-03-24 DIAGNOSIS — L409 Psoriasis, unspecified: Secondary | ICD-10-CM

## 2019-03-24 DIAGNOSIS — L84 Corns and callosities: Secondary | ICD-10-CM | POA: Insufficient documentation

## 2019-03-24 DIAGNOSIS — F988 Other specified behavioral and emotional disorders with onset usually occurring in childhood and adolescence: Secondary | ICD-10-CM

## 2019-03-24 DIAGNOSIS — K760 Fatty (change of) liver, not elsewhere classified: Secondary | ICD-10-CM

## 2019-03-24 DIAGNOSIS — F17200 Nicotine dependence, unspecified, uncomplicated: Secondary | ICD-10-CM

## 2019-03-24 DIAGNOSIS — Z7185 Encounter for immunization safety counseling: Secondary | ICD-10-CM

## 2019-03-24 DIAGNOSIS — Z823 Family history of stroke: Secondary | ICD-10-CM

## 2019-03-24 LAB — LIPID PANEL

## 2019-03-24 MED ORDER — TRIAMCINOLONE ACETONIDE 0.1 % EX CREA: 1.0000 "application " | TOPICAL_CREAM | Freq: Two times a day (BID) | CUTANEOUS | 0 refills | Status: DC

## 2019-03-24 NOTE — Patient Instructions (Addendum)
Encounter Diagnoses  Name Primary?  . Encounter for health maintenance examination in adult Yes  . Essential hypertension   . Fatty liver   . Psoriasis   . Attention deficit disorder, unspecified hyperactivity presence   . Recovering alcoholic ([place])   . History of alcohol abuse   . Hyperlipidemia, unspecified hyperlipidemia type   . Vitamin D deficiency   . Vaccine counseling   . Smoker   . Corn of foot   . Need for pneumococcal vaccination    Recommendations:   Psoriasis   Use either daily moisturizing lotion such as Lubriderm or Coal Tar cream OTC for daily prevention  When you have a flare up, use OTC Hydrocortisone cream for the facial psoriasis rash, and you can use Triamcinolone cream for the elbow or areas other than the face or penis    Work on quitting smoking.   We can use medications like Wellbutrin or nicotine patch to help.    Glad you are doing well and remaining sober.  Continue AA meetings   Get exercise most days per week   Get an eye doctor appointment and see them yearly.  Likewise see dentist twice yearly for cleaning and hygiene visits   Corn of foot - you can try pumice stone sanding of the corn after soaking feet nightly.    Use the salicylic acid daily.  This may take a few weeks.      Health Maintenance, Male A healthy lifestyle and preventive care is important for your health and wellness. Ask your health care provider about what schedule of regular examinations is right for you. What should I know about weight and diet? Eat a Healthy Diet  Eat plenty of vegetables, fruits, whole grains, low-fat dairy products, and lean protein.  Do not eat a lot of foods high in solid fats, added sugars, or salt.  Maintain a Healthy Weight Regular exercise can help you achieve or maintain a healthy weight. You should:  Do at least 150 minutes of exercise each week. The exercise should increase your heart rate and make you sweat (moderate-intensity  exercise).  Do strength-training exercises at least twice a week. Watch Your Levels of Cholesterol and Blood Lipids  Have your blood tested for lipids and cholesterol every 5 years starting at 38 years of age. If you are at high risk for heart disease, you should start having your blood tested when you are 38 years old. You may need to have your cholesterol levels checked more often if: ? Your lipid or cholesterol levels are high. ? You are older than 38 years of age. ? You are at high risk for heart disease. What should I know about cancer screening? Many types of cancers can be detected early and may often be prevented. Lung Cancer  You should be screened every year for lung cancer if: ? You are a current smoker who has smoked for at least 30 years. ? You are a former smoker who has quit within the past 15 years.  Talk to your health care provider about your screening options, when you should start screening, and how often you should be screened. Colorectal Cancer  Routine colorectal cancer screening usually begins at 38 years of age and should be repeated every 5-10 years until you are 38 years old. You may need to be screened more often if early forms of precancerous polyps or small growths are found. Your health care provider may recommend screening at an earlier age if you  have risk factors for colon cancer.  Your health care provider may recommend using home test kits to check for hidden blood in the stool.  A small camera at the end of a tube can be used to examine your colon (sigmoidoscopy or colonoscopy). This checks for the earliest forms of colorectal cancer. Prostate and Testicular Cancer  Depending on your age and overall health, your health care provider may do certain tests to screen for prostate and testicular cancer.  Talk to your health care provider about any symptoms or concerns you have about testicular or prostate cancer. Skin Cancer  Check your skin from head  to toe regularly.  Tell your health care provider about any new moles or changes in moles, especially if: ? There is a change in a mole's size, shape, or color. ? You have a mole that is larger than a pencil eraser.  Always use sunscreen. Apply sunscreen liberally and repeat throughout the day.  Protect yourself by wearing long sleeves, pants, a wide-brimmed hat, and sunglasses when outside. What should I know about heart disease, diabetes, and high blood pressure?  If you are 28-34 years of age, have your blood pressure checked every 3-5 years. If you are 74 years of age or older, have your blood pressure checked every year. You should have your blood pressure measured twice-once when you are at a hospital or clinic, and once when you are not at a hospital or clinic. Record the average of the two measurements. To check your blood pressure when you are not at a hospital or clinic, you can use: ? An automated blood pressure machine at a pharmacy. ? A home blood pressure monitor.  Talk to your health care provider about your target blood pressure.  If you are between 18-47 years old, ask your health care provider if you should take aspirin to prevent heart disease.  Have regular diabetes screenings by checking your fasting blood sugar level. ? If you are at a normal weight and have a low risk for diabetes, have this test once every three years after the age of 18. ? If you are overweight and have a high risk for diabetes, consider being tested at a younger age or more often.  A one-time screening for abdominal aortic aneurysm (AAA) by ultrasound is recommended for men aged 56-75 years who are current or former smokers. What should I know about preventing infection? Hepatitis B If you have a higher risk for hepatitis B, you should be screened for this virus. Talk with your health care provider to find out if you are at risk for hepatitis B infection. Hepatitis C Blood testing is recommended  for:  Everyone born from [birth year] through 1965.  Anyone with known risk factors for hepatitis C. Sexually Transmitted Diseases (STDs)  You should be screened each year for STDs including gonorrhea and chlamydia if: ? You are sexually active and are younger than 38 years of age. ? You are older than 38 years of age and your health care provider tells you that you are at risk for this type of infection. ? Your sexual activity has changed since you were last screened and you are at an increased risk for chlamydia or gonorrhea. Ask your health care provider if you are at risk.  Talk with your health care provider about whether you are at high risk of being infected with HIV. Your health care provider may recommend a prescription medicine to help prevent HIV infection. What else can  I do?  Schedule regular health, dental, and eye exams.  Stay current with your vaccines (immunizations).  Do not use any tobacco products, such as cigarettes, chewing tobacco, and e-cigarettes. If you need help quitting, ask your health care provider.  Limit alcohol intake to no more than 2 drinks per day. One drink equals 12 ounces of beer, 5 ounces of wine, or 1 ounces of hard liquor.  Do not use street drugs.  Do not share needles.  Ask your health care provider for help if you need support or information about quitting drugs.  Tell your health care provider if you often feel depressed.  Tell your health care provider if you have ever been abused or do not feel safe at home. This information is not intended to replace advice given to you by your health care provider. Make sure you discuss any questions you have with your health care provider. Document Released: 04/05/2008 Document Revised: 06/06/2016 Document Reviewed: 07/12/2015 Elsevier Interactive Patient Education  2019 [employer].

## 2019-03-24 NOTE — Progress Notes (Signed)
Subjective:   HPI  Wesley Cruz is a 38 y.o. male who presents for Chief Complaint  Patient presents with  . CPE    fasting CPE     Patient Care Team: , Camelia Eng, PA-C as PCP - General (Family Medicine) Sees dentist Sees eye doctor Dermatology, Allyson Sabal  Concerns: Has new corn left foot under 4th toe MTP.   jsut started using OTC corn solution few days ago.  Still smokes 1/2 ppd.   Just celebrated 2 years of sobriety on May 4.   Wife is brand Community education officer for [employer].    Has 2 children 2yo and 79yo, and 58yo is riding bike without training wheels.   Reviewed their medical, surgical, family, social, medication, and allergy history and updated chart as appropriate.  Past Medical History:  Diagnosis Date  . Allergy   . Anxiety   . Bee sting-induced anaphylaxis 2013  . History of alcohol abuse    went through residential treatment program 2018  . Hyperlipidemia 6/15  . Hypertension 6/15  . Laceration 2009   right hand  . Seizure ([place]) 2009   questionable seizure, syncope, one episode, none since - seen at [facility] ED  . Smoker   . Tobacco use     Past Surgical History:  Procedure Laterality Date  . LACERATION REPAIR  2009   right dorsal hand; s/p punching wall    Social History   Socioeconomic History  . Marital status: Married    Spouse name: Not on file  . Number of children: Not on file  . Years of education: Not on file  . Highest education level: Not on file  Occupational History  . Not on file  Social Needs  . Financial resource strain: Not on file  . Food insecurity:    Worry: Not on file    Inability: Not on file  . Transportation needs:    Medical: Not on file    Non-medical: Not on file  Tobacco Use  . Smoking status: Current Every Day Smoker    Packs/day: 0.50    Years: 13.00    Pack years: 6.50    Types: Cigarettes  . Smokeless tobacco: Never Used  Substance and Sexual Activity  . Alcohol use: No    Comment: significant in past,  went through recovery program 2018  . Drug use: No  . Sexual activity: Not on file  Lifestyle  . Physical activity:    Days per week: Not on file    Minutes per session: Not on file  . Stress: Not on file  Relationships  . Social connections:    Talks on phone: Not on file    Gets together: Not on file    Attends religious service: Not on file    Active member of club or organization: Not on file    Attends meetings of clubs or organizations: Not on file    Relationship status: Not on file  . Intimate partner violence:    Fear of current or ex partner: Not on file    Emotionally abused: Not on file    Physically abused: Not on file    Forced sexual activity: Not on file  Other Topics Concern  . Not on file  Social History Narrative   Married, has dogs, 2 children, exercise - walking.   Working at [employer], [place] for [employer].  [employer]. 03/2019     Family History  Problem Relation Age of Onset  .  Hypertension Father   . Hypertension Paternal Uncle   . Stroke Paternal Uncle 37  . Cancer Paternal Grandmother        lung?  Marland Kitchen COPD Paternal Grandmother   . COPD Paternal Grandfather   . Stroke Paternal Uncle 62  . Heart disease Neg Hx   . Diabetes Neg Hx      Current Outpatient Medications:  .  cholecalciferol (VITAMIN D) 1000 units tablet, Take 1 tablet (1,000 Units total) by mouth daily., Disp: 30 tablet, Rfl: 11 .  EPINEPHrine (EPIPEN 2-PAK) 0.3 mg/0.3 mL IJ SOAJ injection, Inject 0.3 mLs (0.3 mg total) into the muscle once as needed (for severe allergic reaction). CAll 911 immediately if you have to use this medicine, Disp: 1 Device, Rfl: 1 .  hydrochlorothiazide (HYDRODIURIL) 25 MG tablet, TAKE 1 TABLET(25 MG) BY MOUTH DAILY, Disp: 30 tablet, Rfl: 10 .  omega-3 acid ethyl esters (LOVAZA) 1 g capsule, Take 2 capsules (2 g total) by mouth 2 (two) times daily., Disp: 120 capsule, Rfl: 10 .  rosuvastatin (CRESTOR) 40 MG tablet, TAKE 1  TABLET(40 MG) BY MOUTH DAILY, Disp: 30 tablet, Rfl: 10 .  hydrocortisone (PROCTOSOL HC) 2.5 % rectal cream, Place 1 application rectally 2 (two) times daily. (Patient not taking: Reported on 11/10/2018), Disp: 30 g, Rfl: 0 .  triamcinolone cream (KENALOG) 0.1 %, Apply 1 application topically 2 (two) times daily., Disp: 45 g, Rfl: 0  Allergies  Allergen Reactions  . Hornet Venom Anaphylaxis    Japanese hornet  . Lisinopril     cough    Review of Systems Constitutional: -fever, -chills, -sweats, -unexpected weight change, -decreased appetite, -fatigue Allergy: -sneezing, -itching, -congestion Dermatology: -changing moles, --rash, -lumps ENT: -runny nose, -ear pain, -sore throat, -hoarseness, -sinus pain, -teeth pain, - ringing in ears, -hearing loss, -nosebleeds Cardiology: -chest pain, -palpitations, -swelling, -difficulty breathing when lying flat, -waking up short of breath Respiratory: -cough, -shortness of breath, -difficulty breathing with exercise or exertion, -wheezing, -coughing up blood Gastroenterology: -abdominal pain, -nausea, -vomiting, -diarrhea, -constipation, -blood in stool, -changes in bowel movement, -difficulty swallowing or eating Hematology: -bleeding, -bruising  Musculoskeletal: -joint aches, -muscle aches, -joint swelling, -back pain, -neck pain, -cramping, -changes in gait Ophthalmology: denies vision changes, eye redness, itching, discharge Urology: -burning with urination, -difficulty urinating, -blood in urine, -urinary frequency, -urgency, -incontinence Neurology: -headache, -weakness, -tingling, -numbness, -memory loss, -falls, -dizziness Psychology: -depressed mood, -agitation, -sleep problems Male GU: no testicular mass, pain, no lymph nodes swollen, no swelling, no rash.     Objective:  BP 130/84   Pulse 72   Temp 98.3 F (36.8 C) (Oral)   Resp 16   Ht 6\' 3"  (1.905 m)   Wt 248 lb 12.8 oz (112.9 kg)   SpO2 98%   BMI 31.10 kg/m   General  appearance: alert, no distress, WD/WN, white male Skin: left volar foot over 4th foot MTP with corn, tender, left great toenail with some brownish coloration and more brittle nail than other nails, otherwise scattered macules, no worrisome lesions.    HEENT: normocephalic, conjunctiva/corneas normal, sclerae anicteric, PERRLA, EOMi, nares patent, no discharge or erythema, pharynx normal Oral cavity: MMM, tongue normal, teeth normal Neck: supple, no lymphadenopathy, no thyromegaly, no masses, normal ROM, no bruits Chest: non tender, normal shape and expansion Heart: RRR, normal S1, S2, no murmurs Lungs: CTA bilaterally, no wheezes, rhonchi, or rales Abdomen: +bs, soft, non tender, non distended, no masses, no hepatomegaly, no splenomegaly, no bruits Back: non tender, normal ROM,  no scoliosis Musculoskeletal: upper extremities non tender, no obvious deformity, normal ROM throughout, lower extremities non tender, no obvious deformity, normal ROM throughout Extremities: no edema, no cyanosis, no clubbing Pulses: 2+ symmetric, upper and lower extremities, normal cap refill Neurological: alert, oriented x 3, CN2-12 intact, strength normal upper extremities and lower extremities, sensation normal throughout, DTRs 2+ throughout, no cerebellar signs, gait normal Psychiatric: normal affect, behavior normal, pleasant  GU: normal male external genitalia, circ, nontender, no masses, no hernia, no lymphadenopathy Rectal: deferred   Assessment and Plan :   Encounter Diagnoses  Name Primary?  . Encounter for health maintenance examination in adult Yes  . Essential hypertension   . Fatty liver   . Psoriasis   . Attention deficit disorder, unspecified hyperactivity presence   . Recovering alcoholic ([place])   . History of alcohol abuse   . Hyperlipidemia, unspecified hyperlipidemia type   . Vitamin D deficiency   . Vaccine counseling   . Smoker   . Corn of foot   . Need for pneumococcal vaccination    . Family history of stroke     Physical exam - discussed and counseled on healthy lifestyle, diet, exercise, preventative care, vaccinations, sick and well care, proper use of emergency dept and after hours care, and addressed their concerns.    Health screening: See your eye doctor yearly for routine vision care. See your dentist yearly for routine dental care including hygiene visits twice yearly.  Cancer screening Advised monthly self testicular exam  Vaccinations: Advised yearly influenza vaccine  Counseled on the pneumococcal vaccine.  Vaccine information sheet given.  Pneumococcal vaccine PPSV23 given after consent obtained.  Separate significant issues discussed: Smoker - encouraged him to quit.    Congratulated on sustained sobriety  HTN - c/t same medication  Hyperlipidemia - c/t same medication  Will set up for carotid US screen given risk factors of tobacco use, HTN, hyperlipidemia, family hx/o stroke in paternal uncles in early 21s.   Corn of foot - used razor today to remove superficial skin so the OTC corn remover solution will penetrate better.   Can use donut pad.  If not improving in the next few weeks, consider seeing dermatology  Psoriasis - use daily moisturizing lotion, reserve hydrocortisone OTC for face and triamcinolone for non face ares of flare up  Leonid was seen today for cpe.  Diagnoses and all orders for this visit:  Encounter for health maintenance examination in adult -     Comprehensive metabolic panel -     CBC -     Hemoglobin A1c -     Lipid panel  Essential hypertension -     VAS US CAROTID; Future  Fatty liver  Psoriasis  Attention deficit disorder, unspecified hyperactivity presence  Recovering alcoholic ([place])  History of alcohol abuse  Hyperlipidemia, unspecified hyperlipidemia type -     Lipid panel -     VAS US CAROTID; Future  Vitamin D deficiency  Vaccine counseling  Smoker -     VAS US CAROTID;  Future  Corn of foot  Need for pneumococcal vaccination  Family history of stroke -     VAS US CAROTID; Future  Other orders -     triamcinolone cream (KENALOG) 0.1 %; Apply 1 application topically 2 (two) times daily. -     Pneumococcal polysaccharide vaccine 23-valent greater than or equal to 2yo subcutaneous/IM    Follow-up pending labs, yearly for physical

## 2019-03-25 ENCOUNTER — Other Ambulatory Visit: Payer: Self-pay

## 2019-03-25 LAB — CBC
Hematocrit: 47.1 % (ref 37.5–51.0)
Hemoglobin: 16.2 g/dL (ref 13.0–17.7)
MCH: 30.5 pg (ref 26.6–33.0)
MCHC: 34.4 g/dL (ref 31.5–35.7)
MCV: 89 fL (ref 79–97)
Platelets: 244 10*3/uL (ref 150–450)
RBC: 5.32 x10E6/uL (ref 4.14–5.80)
RDW: 12.5 % (ref 11.6–15.4)
WBC: 7.9 10*3/uL (ref 3.4–10.8)

## 2019-03-25 LAB — COMPREHENSIVE METABOLIC PANEL
ALT: 40 IU/L (ref 0–44)
AST: 24 IU/L (ref 0–40)
Albumin/Globulin Ratio: 2.6 — ABNORMAL HIGH (ref 1.2–2.2)
Albumin: 5.2 g/dL — ABNORMAL HIGH (ref 4.0–5.0)
Alkaline Phosphatase: 65 IU/L (ref 39–117)
BUN/Creatinine Ratio: 13 (ref 9–20)
BUN: 11 mg/dL (ref 6–20)
Bilirubin Total: 0.7 mg/dL (ref 0.0–1.2)
CO2: 23 mmol/L (ref 20–29)
Calcium: 9.9 mg/dL (ref 8.7–10.2)
Chloride: 100 mmol/L (ref 96–106)
Creatinine, Ser: 0.85 mg/dL (ref 0.76–1.27)
GFR calc Af Amer: 129 mL/min/{1.73_m2} (ref >59)
GFR calc non Af Amer: 111 mL/min/{1.73_m2} (ref >59)
Globulin, Total: 2 g/dL (ref 1.5–4.5)
Glucose: 94 mg/dL (ref 65–99)
Potassium: 4.3 mmol/L (ref 3.5–5.2)
Sodium: 140 mmol/L (ref 134–144)
Total Protein: 7.2 g/dL (ref 6.0–8.5)

## 2019-03-25 LAB — LIPID PANEL
Chol/HDL Ratio: 2.4 ratio (ref 0.0–5.0)
Cholesterol, Total: 95 mg/dL — ABNORMAL LOW (ref 100–199)
HDL: 39 mg/dL — ABNORMAL LOW (ref >39)
LDL Calculated: 46 mg/dL (ref 0–99)
Triglycerides: 51 mg/dL (ref 0–149)
VLDL Cholesterol Cal: 10 mg/dL (ref 5–40)

## 2019-03-25 LAB — HEMOGLOBIN A1C
Est. average glucose Bld gHb Est-mCnc: 108 mg/dL
Hgb A1c MFr Bld: 5.4 % (ref 4.8–5.6)

## 2019-03-26 ENCOUNTER — Encounter (HOSPITAL_COMMUNITY): Payer: 59

## 2019-03-30 ENCOUNTER — Ambulatory Visit (HOSPITAL_COMMUNITY): Payer: 59

## 2019-05-09 ENCOUNTER — Other Ambulatory Visit: Payer: Self-pay | Admitting: Medical

## 2019-05-14 ENCOUNTER — Telehealth: Payer: Self-pay | Admitting: Medical

## 2019-05-14 MED ORDER — HYDROCHLOROTHIAZIDE 25 MG PO TABS: ORAL_TABLET | ORAL | 10 refills | Status: DC

## 2019-05-14 NOTE — Telephone Encounter (Signed)
Pt called and needs refill on Hydrochlorothiazide sent to the [facility] on [address]

## 2019-06-24 ENCOUNTER — Other Ambulatory Visit: Payer: Self-pay | Admitting: Medical

## 2019-09-15 ENCOUNTER — Encounter: Payer: Self-pay | Admitting: Family Medicine

## 2019-09-15 ENCOUNTER — Ambulatory Visit: Payer: 59 | Admitting: Family Medicine

## 2019-09-15 ENCOUNTER — Other Ambulatory Visit: Payer: Self-pay

## 2019-09-15 VITALS — BP 124/82 | HR 77 | Temp 97.5°F | Wt 250.1 lb

## 2019-09-15 DIAGNOSIS — L209 Atopic dermatitis, unspecified: Secondary | ICD-10-CM | POA: Diagnosis not present

## 2019-09-15 NOTE — Progress Notes (Signed)
   Subjective:    Patient ID: Wesley Cruz, male    DOB: 08-Nov-1980, 38 y.o.   MRN: [id number]  HPI He is here for consult concerning a rash on his right thigh and right arm.  Both of these occurred after some minimal trauma.   Review of Systems     Objective:   Physical Exam  Alert and in no distress.  Exam of the right arm does show linear erythema with an area totally clear proximal to that.  The area of erythema was where he had tape on.  The rash on his leg occurred after getting a minimal abrasion.  Dermatographia is him is noted.      Assessment & Plan:  Atopic dermatitis, unspecified type I explained that he is atopic and does have dermatographia.  Recommend cool compresses and antihistamine like Claritin during the day and Benadryl at night.  He can also use Tagamet.  He was comfortable with that.

## 2019-09-15 NOTE — Patient Instructions (Signed)

## 2019-11-16 ENCOUNTER — Other Ambulatory Visit: Payer: Self-pay | Admitting: Medical

## 2019-11-16 ENCOUNTER — Other Ambulatory Visit: Payer: Self-pay

## 2019-11-16 MED ORDER — EPINEPHRINE 0.3 MG/0.3ML IJ SOAJ: 0.3000 mg | Freq: Once | INTRAMUSCULAR | 1 refills | Status: DC | PRN

## 2019-12-26 ENCOUNTER — Ambulatory Visit: Payer: 59 | Attending: Internal Medicine

## 2019-12-26 DIAGNOSIS — Z23 Encounter for immunization: Secondary | ICD-10-CM

## 2019-12-26 NOTE — Progress Notes (Signed)
   Covid-19 Vaccination Clinic  Name:  Wesley Cruz    MRN: [id number] DOB: 1981/02/16  12/26/2019  [name] was observed post Covid-19 immunization for 15 minutes without incident. He was provided with Vaccine Information Sheet and instruction to access the V-Safe system.   [name] was instructed to call 911 with any severe reactions post vaccine: Marland Kitchen Difficulty breathing  . Swelling of face and throat  . A fast heartbeat  . A bad rash all over body  . Dizziness and weakness   Immunizations Administered    Name Date Dose VIS Date Route   Pfizer COVID-19 Vaccine 12/26/2019  5:18 PM 0.3 mL 10/02/2019 Intramuscular   Manufacturer: [place]   Lot: [id number]   [place]: [id number]

## 2019-12-27 ENCOUNTER — Ambulatory Visit: Payer: 59

## 2020-01-04 ENCOUNTER — Ambulatory Visit: Payer: 59 | Admitting: Medical

## 2020-01-04 ENCOUNTER — Other Ambulatory Visit: Payer: Self-pay

## 2020-01-04 ENCOUNTER — Encounter: Payer: Self-pay | Admitting: Medical

## 2020-01-04 VITALS — BP 128/72 | HR 64 | Temp 97.8°F | Ht 76.0 in | Wt 248.0 lb

## 2020-01-04 DIAGNOSIS — K649 Unspecified hemorrhoids: Secondary | ICD-10-CM | POA: Diagnosis not present

## 2020-01-04 DIAGNOSIS — Z889 Allergy status to unspecified drugs, medicaments and biological substances status: Secondary | ICD-10-CM | POA: Insufficient documentation

## 2020-01-04 DIAGNOSIS — L84 Corns and callosities: Secondary | ICD-10-CM

## 2020-01-04 MED ORDER — HYDROCORTISONE 2.5 % EX CREA: TOPICAL_CREAM | Freq: Two times a day (BID) | CUTANEOUS | 1 refills | Status: DC

## 2020-01-04 MED ORDER — EPINEPHRINE 0.3 MG/0.3ML IJ SOAJ: 0.3000 mg | Freq: Once | INTRAMUSCULAR | 1 refills | Status: DC | PRN

## 2020-01-04 NOTE — Progress Notes (Signed)
Subjective:  Wesley Cruz is a 39 y.o. male who presents for Chief Complaint  Patient presents with  . Foot Problem     His main concern is a corn on the left foot.  This is over the fourth metatarsal.  He has seen dermatology twice for this.  They shaved off the top layer skin that has been on back.  He has been using some salicylic acid over-the-counter.  Dermatology told him if it  does not resolve that he would need to see podiatry .  He does not necessarily want to do that yet.  He has several days of golf treatment coming up after this weekend so he does not want any procedure right now that is going to disrupt this.  He notes a hemorrhoid issue for several months now.  The hemorrhoid will not go away.  It is annoying but not painful.  Because of the hemorrhoidal tissue he has to wipe multiple times a day for hygiene purposes  He request a refill on EpiPen to have on hand for possible allergic reaction going forward.  Has a history of allergic reaction in the past  No other aggravating or relieving factors.    No other c/o.  The following portions of the patient's history were reviewed and updated as appropriate: allergies, current medications, past family history, past medical history, past social history, past surgical history and problem list.  ROS Otherwise as in subjective above  Objective: BP 128/72   Pulse 64   Temp 97.8 F (36.6 C)   Ht 6\' 4"  (1.93 m)   Wt 248 lb (112.5 kg)   SpO2 95%   BMI 30.19 kg/m   General appearance: alert, no distress, well developed, well nourished Skin:  Left volar foot over 4th metatarsal MTP joint, is a tender round thickened lesion c/w corn, no other deformity DRE: left posterolateral anus with 1.3cm diameter raised lesion suggestive of nonthrombosed hemorrhoid, lateral left to this is an area with 30mm oval opening of skin from possible prior thrombosed hemorrhoid.       Assessment: Encounter Diagnoses  Name Primary?  . Corn of  foot Yes  . Hemorrhoids, unspecified hemorrhoid type   . History of allergic reaction      Plan: Corn of foot - discussed findings.   Used sterile razor to shave off dead skin to expose corn better.  Discussed options for therapy.  He wants to start with OTC Salicylic acid daily QHS.  If not seeing improvements within 3-4 weeks, refer to podiatry.  He already saw dermatology for this.    Use donut pads for comfort  Hemorrhoids - discussed possible rubber band therapy vs other therapies to resolve the tissue.   I will inquire with Dr. Hoyt Koch who he has seen before to see if they do banding.   In the meantime, discussed hot bath soaks, prn short term hydrocortisone topically for itching, flare up.  Discussed difference between thrombosed hemorrhoid  history of allergic reaction - refilled Epipen for prn use.     Wesley Cruz was seen today for foot problem.  Diagnoses and all orders for this visit:  Corn of foot  Hemorrhoids, unspecified hemorrhoid type  History of allergic reaction  Other orders -     EPINEPHrine (EPIPEN 2-PAK) 0.3 mg/0.3 mL IJ SOAJ injection; Inject 0.3 mLs (0.3 mg total) into the muscle once as needed (for severe allergic reaction). CAll 911 immediately if you have to use this medicine -  hydrocortisone 2.5 % cream; Apply topically 2 (two) times daily.    Follow up: pending call back

## 2020-01-13 ENCOUNTER — Other Ambulatory Visit: Payer: Self-pay

## 2020-01-13 DIAGNOSIS — K649 Unspecified hemorrhoids: Secondary | ICD-10-CM

## 2020-01-26 ENCOUNTER — Ambulatory Visit: Payer: 59 | Attending: Internal Medicine

## 2020-01-26 DIAGNOSIS — Z23 Encounter for immunization: Secondary | ICD-10-CM

## 2020-01-26 NOTE — Progress Notes (Signed)
   Covid-19 Vaccination Clinic  Name:  Wesley Cruz    MRN: [id number] DOB: Feb 14, 1981  01/26/2020  [name] was observed post Covid-19 immunization for 15 minutes without incident. He was provided with Vaccine Information Sheet and instruction to access the V-Safe system.   [name] was instructed to call 911 with any severe reactions post vaccine: Marland Kitchen Difficulty breathing  . Swelling of face and throat  . A fast heartbeat  . A bad rash all over body  . Dizziness and weakness   Immunizations Administered    Name Date Dose VIS Date Route   Pfizer COVID-19 Vaccine 01/26/2020  4:21 PM 0.3 mL 10/02/2019 Intramuscular   Manufacturer: [place]   Lot: [id number]   [place]: [id number]

## 2020-01-27 DIAGNOSIS — K760 Fatty (change of) liver, not elsewhere classified: Secondary | ICD-10-CM | POA: Diagnosis not present

## 2020-01-27 DIAGNOSIS — R7401 Elevation of levels of liver transaminase levels: Secondary | ICD-10-CM | POA: Diagnosis not present

## 2020-01-27 DIAGNOSIS — R159 Full incontinence of feces: Secondary | ICD-10-CM | POA: Diagnosis not present

## 2020-03-02 ENCOUNTER — Other Ambulatory Visit: Payer: Self-pay

## 2020-03-02 ENCOUNTER — Encounter: Payer: Self-pay | Admitting: Medical

## 2020-03-02 ENCOUNTER — Ambulatory Visit: Payer: BLUE CROSS/BLUE SHIELD | Admitting: Medical

## 2020-03-02 VITALS — BP 128/70 | HR 87 | Temp 98.1°F | Ht 76.0 in | Wt 249.2 lb

## 2020-03-02 DIAGNOSIS — L03032 Cellulitis of left toe: Secondary | ICD-10-CM | POA: Insufficient documentation

## 2020-03-02 DIAGNOSIS — B351 Tinea unguium: Secondary | ICD-10-CM

## 2020-03-02 DIAGNOSIS — Z79899 Other long term (current) drug therapy: Secondary | ICD-10-CM | POA: Diagnosis not present

## 2020-03-02 MED ORDER — AMOXICILLIN-POT CLAVULANATE 875-125 MG PO TABS: 1.0000 | ORAL_TABLET | Freq: Two times a day (BID) | ORAL | 0 refills | Status: DC

## 2020-03-02 MED ORDER — TERBINAFINE HCL 250 MG PO TABS: 250.0000 mg | ORAL_TABLET | Freq: Every day | ORAL | 0 refills | Status: DC

## 2020-03-02 NOTE — Progress Notes (Signed)
Subjective:  Wesley Cruz is a 39 y.o. male who presents for Chief Complaint  Patient presents with  . Ingrown Toenail    left great toe      Here for pain of left great toe.  He has hx/o ingrowing nails, and has been careful over the years to not cut toenails rounded or too short.   He notes tender spot of left great toe.  No drainage, no redness, no warmth.  Has golf tournament this weekend, wants to do something to help the pain.  Wants toenail partially removed.  No other aggravating or relieving factors.    No other c/o.  The following portions of the patient's history were reviewed and updated as appropriate: allergies, current medications, past family history, past medical history, past social history, past surgical history and problem list.  ROS Otherwise as in subjective above  Objective: BP 128/70   Pulse 87   Temp 98.1 F (36.7 C)   Ht 6\' 4"  (1.93 m)   Wt 249 lb 3.2 oz (113 kg)   SpO2 98%   BMI 30.33 kg/m   General appearance: alert, no distress, well developed, well nourished Left great toenail with distal bilat corners of nail with yellowish discoloration, some slight lifting of those corners, tender along medial nail bed skin with slight induration but approx mid length of nail.  No obvious ingrowing.  No swelling or pus drainage.  Other toenails distally on left foot with orange yellow discoloration suggestive of fungus Feet and toes neurovascularly intact and normal cap refill     Assessment: Encounter Diagnoses  Name Primary?  Marland Kitchen Onychomycosis Yes  . Infection of nail bed of toe of left foot   . High risk medication use      Plan: Discussed findings, symptoms.  No obvious ingrowing.   There is possible early nail bed/paronychia infection and onychomycosis with some toenail lifting.    Lab today to prescreen liver tests.    Begin Augmentin, can use short term NSAID OTC.  If liver tests normal, can begin Lamisil oral.  If not improving in the coming  weeks or worse, can see podiatry  Cullen was seen today for ingrown toenail.  Diagnoses and all orders for this visit:  Onychomycosis -     Hepatic function panel  Infection of nail bed of toe of left foot -     Hepatic function panel  High risk medication use  Other orders -     amoxicillin-clavulanate (AUGMENTIN) 875-125 MG tablet; Take 1 tablet by mouth 2 (two) times daily. -     terbinafine (LAMISIL) 250 MG tablet; Take 1 tablet (250 mg total) by mouth daily.    Follow up: pending treatment, pending labs

## 2020-03-03 ENCOUNTER — Other Ambulatory Visit: Payer: Self-pay | Admitting: Medical

## 2020-03-03 DIAGNOSIS — R935 Abnormal findings on diagnostic imaging of other abdominal regions, including retroperitoneum: Secondary | ICD-10-CM

## 2020-03-03 DIAGNOSIS — R7989 Other specified abnormal findings of blood chemistry: Secondary | ICD-10-CM

## 2020-03-03 LAB — HEPATIC FUNCTION PANEL
ALT: 60 IU/L — ABNORMAL HIGH (ref 0–44)
AST: 31 IU/L (ref 0–40)
Albumin: 5.3 g/dL — ABNORMAL HIGH (ref 4.0–5.0)
Alkaline Phosphatase: 66 IU/L (ref 39–117)
Bilirubin Total: 0.6 mg/dL (ref 0.0–1.2)
Bilirubin, Direct: 0.19 mg/dL (ref 0.00–0.40)
Total Protein: 7.3 g/dL (ref 6.0–8.5)

## 2020-03-03 MED ORDER — CICLOPIROX 8 % EX SOLN: Freq: Every day | CUTANEOUS | 0 refills | Status: DC

## 2020-03-08 ENCOUNTER — Ambulatory Visit
Admission: RE | Admit: 2020-03-08 | Discharge: 2020-03-08 | Disposition: A | Discharge status: Home / Self Care | Payer: BC Managed Care – PPO | Source: Ambulatory Visit | Attending: Medical | Admitting: Medical

## 2020-03-08 DIAGNOSIS — R7989 Other specified abnormal findings of blood chemistry: Secondary | ICD-10-CM | POA: Diagnosis not present

## 2020-03-08 DIAGNOSIS — R935 Abnormal findings on diagnostic imaging of other abdominal regions, including retroperitoneum: Secondary | ICD-10-CM

## 2020-03-10 ENCOUNTER — Other Ambulatory Visit: Payer: Self-pay

## 2020-03-10 ENCOUNTER — Encounter: Payer: Self-pay | Admitting: Gastroenterology

## 2020-03-10 DIAGNOSIS — R7989 Other specified abnormal findings of blood chemistry: Secondary | ICD-10-CM

## 2020-04-12 ENCOUNTER — Encounter: Payer: Self-pay | Admitting: Medical

## 2020-04-18 ENCOUNTER — Encounter: Payer: Self-pay | Admitting: Gastroenterology

## 2020-04-18 ENCOUNTER — Ambulatory Visit: Payer: BC Managed Care – PPO | Admitting: Gastroenterology

## 2020-04-26 ENCOUNTER — Other Ambulatory Visit: Payer: Self-pay | Admitting: Family Medicine

## 2020-05-09 ENCOUNTER — Ambulatory Visit: Payer: BLUE CROSS/BLUE SHIELD | Admitting: Medical

## 2020-05-09 ENCOUNTER — Other Ambulatory Visit: Payer: Self-pay

## 2020-05-09 ENCOUNTER — Encounter: Payer: Self-pay | Admitting: Medical

## 2020-05-09 VITALS — BP 110/70 | HR 61 | Ht 76.0 in | Wt 250.0 lb

## 2020-05-09 DIAGNOSIS — Z889 Allergy status to unspecified drugs, medicaments and biological substances status: Secondary | ICD-10-CM

## 2020-05-09 DIAGNOSIS — K649 Unspecified hemorrhoids: Secondary | ICD-10-CM

## 2020-05-09 DIAGNOSIS — Z6830 Body mass index (BMI) 30.0-30.9, adult: Secondary | ICD-10-CM

## 2020-05-09 DIAGNOSIS — M25562 Pain in left knee: Secondary | ICD-10-CM

## 2020-05-09 DIAGNOSIS — Z0000 Encounter for general adult medical examination without abnormal findings: Secondary | ICD-10-CM

## 2020-05-09 DIAGNOSIS — E785 Hyperlipidemia, unspecified: Secondary | ICD-10-CM

## 2020-05-09 DIAGNOSIS — F1011 Alcohol abuse, in remission: Secondary | ICD-10-CM

## 2020-05-09 DIAGNOSIS — I10 Essential (primary) hypertension: Secondary | ICD-10-CM

## 2020-05-09 DIAGNOSIS — F17200 Nicotine dependence, unspecified, uncomplicated: Secondary | ICD-10-CM | POA: Diagnosis not present

## 2020-05-09 DIAGNOSIS — M9420 Chondromalacia, unspecified site: Secondary | ICD-10-CM

## 2020-05-09 DIAGNOSIS — Z823 Family history of stroke: Secondary | ICD-10-CM

## 2020-05-09 DIAGNOSIS — B351 Tinea unguium: Secondary | ICD-10-CM

## 2020-05-09 DIAGNOSIS — E559 Vitamin D deficiency, unspecified: Secondary | ICD-10-CM

## 2020-05-09 DIAGNOSIS — K760 Fatty (change of) liver, not elsewhere classified: Secondary | ICD-10-CM

## 2020-05-09 DIAGNOSIS — L409 Psoriasis, unspecified: Secondary | ICD-10-CM

## 2020-05-09 DIAGNOSIS — R7989 Other specified abnormal findings of blood chemistry: Secondary | ICD-10-CM | POA: Diagnosis not present

## 2020-05-09 DIAGNOSIS — F1021 Alcohol dependence, in remission: Secondary | ICD-10-CM

## 2020-05-09 DIAGNOSIS — G8929 Other chronic pain: Secondary | ICD-10-CM

## 2020-05-09 DIAGNOSIS — Z79899 Other long term (current) drug therapy: Secondary | ICD-10-CM

## 2020-05-09 NOTE — Progress Notes (Signed)
Subjective:   HPI  Wesley Cruz is a 39 y.o. male who presents for Chief Complaint  Patient presents with  . Annual Exam    with fasting labs     Patient Care Team: Aulani Shipton, Camelia Eng, PA-C as PCP - General (Family Medicine) Sees dentist Sees eye doctor  Concerns: Here for physical.  He completed some therapy with ciclopirox topical lacquer.  He feels his nails look much better/resolved.  He recently got 2 pedicures and feels good about his nails at this time.  He went to gastroenterology, saw Dr. Benson Norway recently.  They referred him to Dr. Marcello Moores, general surgery for rectal lesions, problems staying clean.  They discussed his liver tests and liver ultrasound as well.  Regarding elevated liver test, he is sober now since 2018.  Had about 16 years of heavy alcohol use.  No concern for hepatitis.  He is married and monogamous.   Reviewed their medical, surgical, family, social, medication, and allergy history and updated chart as appropriate.  Past Medical History:  Diagnosis Date  . Allergy   . Anxiety   . Bee sting-induced anaphylaxis 2013  . Elevated LFTs    2017 - 2021 ; abnormal [place], negative hepatitis infection 2017 labs  . History of alcohol abuse    16 years of drinking; went through residential treatment program 2018  . Hyperlipidemia 6/15  . Hypertension 6/15  . Laceration 2009   right hand  . Seizure ([place]) 2009   questionable seizure, syncope, one episode, none since - seen at [facility] ED  . Tobacco use     Past Surgical History:  Procedure Laterality Date  . LACERATION REPAIR  2009   right dorsal hand; s/p punching wall    Social History   Socioeconomic History  . Marital status: Married    Spouse name: Not on file  . Number of children: Not on file  . Years of education: Not on file  . Highest education level: Not on file  Occupational History  . Not on file  Tobacco Use  . Smoking status: Current Every Day Smoker    Packs/day: 0.50    Years:  14.00    Pack years: 7.00    Types: Cigarettes  . Smokeless tobacco: Never Used  Vaping Use  . Vaping Use: Never used  Substance and Sexual Activity  . Alcohol use: No    Comment: significant in past, went through recovery program 2018  . Drug use: No  . Sexual activity: Not on file  Other Topics Concern  . Not on file  Social History Narrative   Married, has dogs, 2 children, exercise - walking.   Working at [employer], [place] for [employer].  [employer]. 04/2020   Social Determinants of Health   Financial Resource Strain:   . Difficulty of Paying Living Expenses:   Food Insecurity:   . Worried About Charity fundraiser in the Last Year:   . Arboriculturist in the Last Year:   Transportation Needs:   . Film/video editor (Medical):   Wesley Kitchen Lack of Transportation (Non-Medical):   Physical Activity:   . Days of Exercise per Week:   . Minutes of Exercise per Session:   Stress:   . Feeling of Stress :   Social Connections:   . Frequency of Communication with Friends and Family:   . Frequency of Social Gatherings with Friends and Family:   . Attends Religious Services:   . Active  Member of Clubs or Organizations:   . Attends Archivist Meetings:   Wesley Kitchen Marital Status:   Intimate Partner Violence:   . Fear of Current or Ex-Partner:   . Emotionally Abused:   Wesley Kitchen Physically Abused:   . Sexually Abused:     Family History  Problem Relation Age of Onset  . Hypertension Father   . Hypertension Paternal Uncle   . Stroke Paternal Uncle 5  . Cancer Paternal Grandmother        lung?  Wesley Kitchen COPD Paternal Grandmother   . COPD Paternal Grandfather   . Stroke Paternal Uncle 60  . Heart disease Neg Hx   . Diabetes Neg Hx      Current Outpatient Medications:  .  cholecalciferol (VITAMIN D) 1000 units tablet, Take 1 tablet (1,000 Units total) by mouth daily. (Patient taking differently: Take 5,000 Units by mouth daily. ), Disp: 30 tablet, Rfl:  11 .  hydrochlorothiazide (HYDRODIURIL) 25 MG tablet, TAKE 1 TABLET(25 MG) BY MOUTH DAILY, Disp: 30 tablet, Rfl: 10 .  omega-3 acid ethyl esters (LOVAZA) 1 g capsule, TAKE 2 CAPSULES BY MOUTH TWICE DAILY, Disp: 120 capsule, Rfl: 10 .  EPINEPHrine (EPIPEN 2-PAK) 0.3 mg/0.3 mL IJ SOAJ injection, Inject 0.3 mLs (0.3 mg total) into the muscle once as needed (for severe allergic reaction). CAll 911 immediately if you have to use this medicine (Patient not taking: Reported on 03/02/2020), Disp: 1 each, Rfl: 1 .  rosuvastatin (CRESTOR) 40 MG tablet, TAKE 1 TABLET(40 MG) BY MOUTH DAILY (Patient not taking: Reported on 05/09/2020), Disp: 90 tablet, Rfl: 1  Allergies  Allergen Reactions  . Hornet Venom Anaphylaxis    Japanese hornet  . Lisinopril     cough       Review of Systems Constitutional: -fever, -chills, -sweats, -unexpected weight change, -decreased appetite, -fatigue Allergy: -sneezing, -itching, -congestion Dermatology: -changing moles, --rash, -lumps ENT: -runny nose, -ear pain, -sore throat, -hoarseness, -sinus pain, -teeth pain, - ringing in ears, -hearing loss, -nosebleeds Cardiology: -chest pain, -palpitations, -swelling, -difficulty breathing when lying flat, -waking up short of breath Respiratory: -cough, -shortness of breath, -difficulty breathing with exercise or exertion, -wheezing, -coughing up blood Gastroenterology: -abdominal pain, -nausea, -vomiting, -diarrhea, -constipation, -blood in stool, -changes in bowel movement, -difficulty swallowing or eating Hematology: -bleeding, -bruising  Musculoskeletal: -joint aches, -muscle aches, -joint swelling, -back pain, -neck pain, -cramping, -changes in gait Ophthalmology: denies vision changes, eye redness, itching, discharge Urology: -burning with urination, -difficulty urinating, -blood in urine, -urinary frequency, -urgency, -incontinence Neurology: -headache, -weakness, -tingling, -numbness, -memory loss, -falls,  -dizziness Psychology: -depressed mood, -agitation, -sleep problems Male GU: no testicular mass, pain, no lymph nodes swollen, no swelling, no rash.     Objective:  BP 110/70   Pulse 61   Ht 6\' 4"  (1.93 m)   Wt 250 lb (113.4 kg)   SpO2 96%   BMI 30.43 kg/m   General appearance: alert, no distress, WD/WN, Caucasian male Skin: scattered macules, no worrisome lesions Neck: supple, no lymphadenopathy, no thyromegaly, no masses, normal ROM, no bruits Chest: non tender, normal shape and expansion Heart: RRR, normal S1, S2, no murmurs Lungs: CTA bilaterally, no wheezes, rhonchi, or rales Abdomen: +bs, soft, non tender, non distended, no masses, no hepatomegaly, no splenomegaly, no bruits Back: non tender, normal ROM, no scoliosis Musculoskeletal: upper extremities non tender, no obvious deformity, normal ROM throughout, lower extremities non tender, no obvious deformity, normal ROM throughout Extremities: no edema, no cyanosis, no clubbing Pulses: 2+ symmetric,  upper and lower extremities, normal cap refill Neurological: alert, oriented x 3, CN2-12 intact, strength normal upper extremities and lower extremities, sensation normal throughout, DTRs 2+ throughout, no cerebellar signs, gait normal Psychiatric: normal affect, behavior normal, pleasant  GU: normal male external genitalia,circumcised, nontender, no masses, no hernia, no lymphadenopathy Rectal:deferred   Assessment and Plan :   Encounter Diagnoses  Name Primary?  . Encounter for health maintenance examination in adult Yes  . Smoker   . Essential hypertension   . Hyperlipidemia, unspecified hyperlipidemia type   . Onychomycosis   . Psoriasis   . Fatty liver   . Hemorrhoids, unspecified hemorrhoid type   . Chronic pain of left knee   . Family history of stroke   . History of allergic reaction   . Recovering alcoholic ([place])   . Vitamin D deficiency   . Elevated LFTs   . History of alcohol abuse   . High risk medication  use   . Chondromalacia   . BMI 30.0-30.9,adult     Physical exam - discussed and counseled on healthy lifestyle, diet, exercise, preventative care, vaccinations, sick and well care, proper use of emergency dept and after hours care, and addressed their concerns.    Health screening: See your eye doctor yearly for routine vision care. See your dentist yearly for routine dental care including hygiene visits twice yearly.  Cancer screening Advised monthly self testicular exam  Colonoscopy:  Age 18  Discussed PSA, prostate exam, and prostate cancer screening risks/benefits.   Age 35   Vaccinations: Advised yearly influenza vaccine Up to date on Tetanus and Covid vaccines    Separate significant issues discussed: Hypertension-continue current medication  Hyperlipidemia-recheck labs today fasting.  We had stopped statin in May when his liver tests came back elevated again.  Pending discussion with gastroenterology, will likely restart statin  BMI greater than 30-continue efforts to lose weight through healthy diet and exercise  Elevated liver test-abnormal ultrasound showing likely fatty liver disease versus diffuse hepatocellular disease.  Updated labs today, check an iron level to screen for hemochromatosis, reviewed 2017 negative hepatitis infection labs.  We discussed avoiding alcohol, hepatotoxic medications, eat a healthy low-fat diet and work on losing weight.  History of alcohol abuse-he continues to remain sober thankfully.  "He is doing well in this regard.  Rectal lesion, hemorrhoid, other, concerns with hygiene-gastroenterology referred him to general surgery for further evaluation and treatment  Vitamin D deficiency-continue supplement  Tobacco use-encouraged him to quit smoking   Justus was seen today for annual exam.  Diagnoses and all orders for this visit:  Encounter for health maintenance examination in adult -     Comprehensive metabolic panel -     CBC  with Differential/Platelet -     Lipid panel -     Iron -     VITAMIN D 25 Hydroxy (Vit-D Deficiency, Fractures) -     APTT -     Protime-INR  Smoker  Essential hypertension  Hyperlipidemia, unspecified hyperlipidemia type -     Lipid panel  Onychomycosis  Psoriasis  Fatty liver  Hemorrhoids, unspecified hemorrhoid type  Chronic pain of left knee  Family history of stroke  History of allergic reaction  Recovering alcoholic ([facility])  Vitamin D deficiency  Elevated LFTs -     Comprehensive metabolic panel -     Iron -     APTT -     Protime-INR  History of alcohol abuse  High risk medication use  Chondromalacia  BMI 30.0-30.9,adult    Follow-up pending labs, yearly for physical

## 2020-05-10 LAB — COMPREHENSIVE METABOLIC PANEL
ALT: 29 IU/L (ref 0–44)
AST: 18 IU/L (ref 0–40)
Albumin/Globulin Ratio: 2.6 — ABNORMAL HIGH (ref 1.2–2.2)
Albumin: 4.9 g/dL (ref 4.0–5.0)
Alkaline Phosphatase: 61 IU/L (ref 48–121)
BUN/Creatinine Ratio: 14 (ref 9–20)
BUN: 11 mg/dL (ref 6–20)
Bilirubin Total: 0.8 mg/dL (ref 0.0–1.2)
CO2: 22 mmol/L (ref 20–29)
Calcium: 9.6 mg/dL (ref 8.7–10.2)
Chloride: 104 mmol/L (ref 96–106)
Creatinine, Ser: 0.77 mg/dL (ref 0.76–1.27)
GFR calc Af Amer: 133 mL/min/{1.73_m2} (ref >59)
GFR calc non Af Amer: 115 mL/min/{1.73_m2} (ref >59)
Globulin, Total: 1.9 g/dL (ref 1.5–4.5)
Glucose: 94 mg/dL (ref 65–99)
Potassium: 4.5 mmol/L (ref 3.5–5.2)
Sodium: 140 mmol/L (ref 134–144)
Total Protein: 6.8 g/dL (ref 6.0–8.5)

## 2020-05-10 LAB — LIPID PANEL
Chol/HDL Ratio: 5 ratio (ref 0.0–5.0)
Cholesterol, Total: 191 mg/dL (ref 100–199)
HDL: 38 mg/dL — ABNORMAL LOW (ref >39)
LDL Chol Calc (NIH): 138 mg/dL — ABNORMAL HIGH (ref 0–99)
Triglycerides: 81 mg/dL (ref 0–149)
VLDL Cholesterol Cal: 15 mg/dL (ref 5–40)

## 2020-05-10 LAB — VITAMIN D 25 HYDROXY (VIT D DEFICIENCY, FRACTURES): Vit D, 25-Hydroxy: 41.5 ng/mL (ref 30.0–100.0)

## 2020-05-10 LAB — CBC WITH DIFFERENTIAL/PLATELET
Basophils Absolute: 0 10*3/uL (ref 0.0–0.2)
Basos: 0 %
EOS (ABSOLUTE): 0.3 10*3/uL (ref 0.0–0.4)
Eos: 4 %
Hematocrit: 45.3 % (ref 37.5–51.0)
Hemoglobin: 15.6 g/dL (ref 13.0–17.7)
Immature Grans (Abs): 0 10*3/uL (ref 0.0–0.1)
Immature Granulocytes: 0 %
Lymphocytes Absolute: 2.6 10*3/uL (ref 0.7–3.1)
Lymphs: 34 %
MCH: 31.1 pg (ref 26.6–33.0)
MCHC: 34.4 g/dL (ref 31.5–35.7)
MCV: 90 fL (ref 79–97)
Monocytes Absolute: 0.4 10*3/uL (ref 0.1–0.9)
Monocytes: 5 %
Neutrophils Absolute: 4.4 10*3/uL (ref 1.4–7.0)
Neutrophils: 57 %
Platelets: 239 10*3/uL (ref 150–450)
RBC: 5.01 x10E6/uL (ref 4.14–5.80)
RDW: 12.4 % (ref 11.6–15.4)
WBC: 7.8 10*3/uL (ref 3.4–10.8)

## 2020-05-10 LAB — IRON: Iron: 109 ug/dL (ref 38–169)

## 2020-05-10 LAB — PROTIME-INR
INR: 1 (ref 0.9–1.2)
Prothrombin Time: 10.7 s (ref 9.1–12.0)

## 2020-05-10 LAB — APTT: aPTT: 28 s (ref 24–33)

## 2020-05-18 DIAGNOSIS — L218 Other seborrheic dermatitis: Secondary | ICD-10-CM | POA: Diagnosis not present

## 2020-05-18 DIAGNOSIS — L700 Acne vulgaris: Secondary | ICD-10-CM | POA: Diagnosis not present

## 2020-05-22 IMAGING — US US ABDOMEN COMPLETE
1 series · 14 of 25 positions shown · non-contrast
Comparison: August 28, 2016.

CLINICAL DATA: Elevated liver function tests.

EXAM:
ABDOMEN ULTRASOUND COMPLETE

[Series 1: us abdomen complete · 0.20mm/px · 14 of 87 slices shown]
[im 1/87]
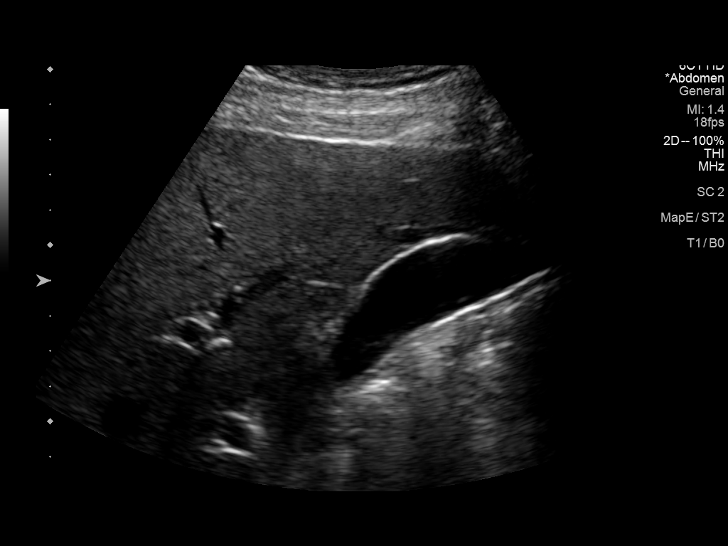
[im 8/87]
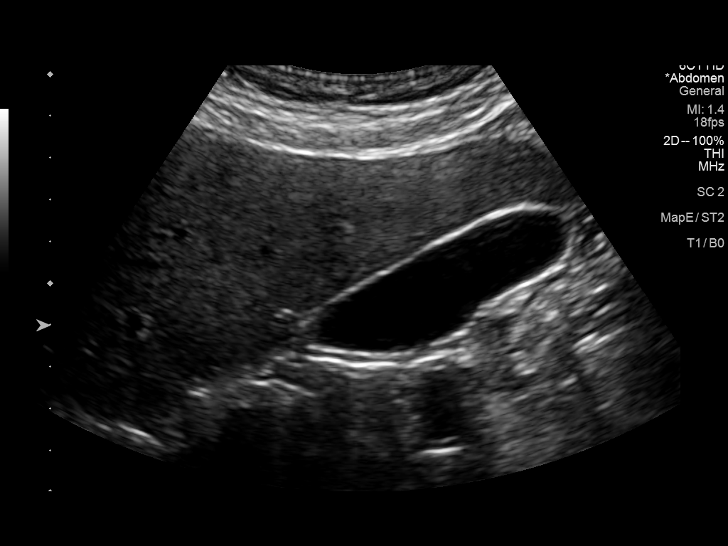
[im 15/87]
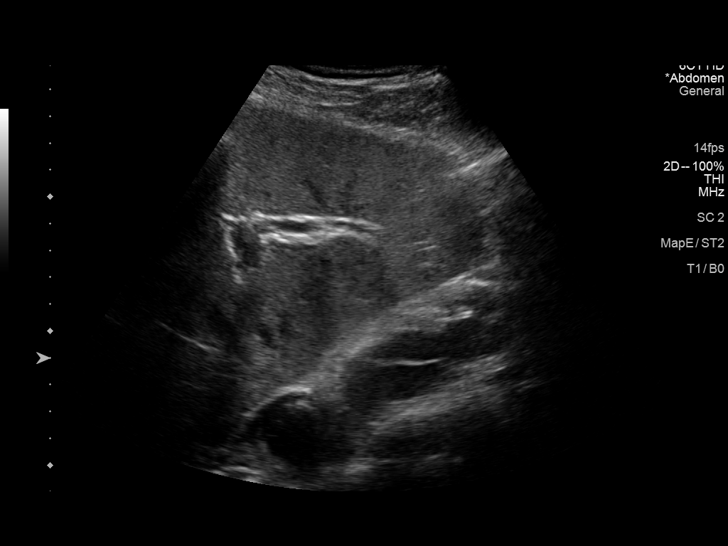
[im 22/87]
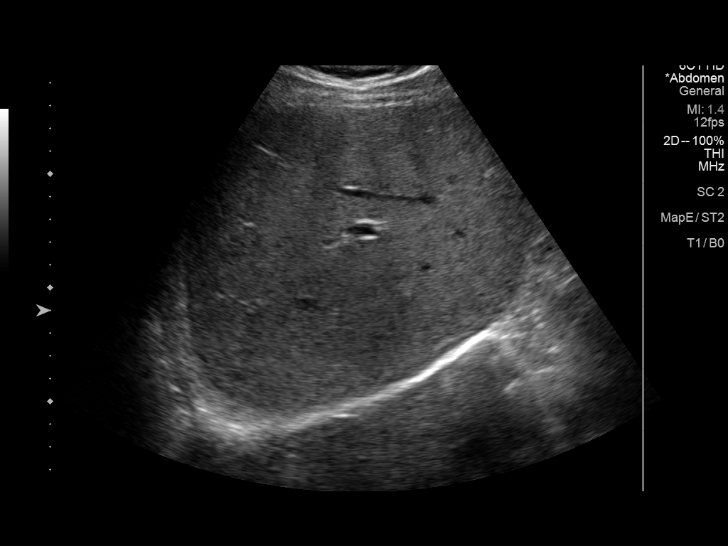
[im 29/87]
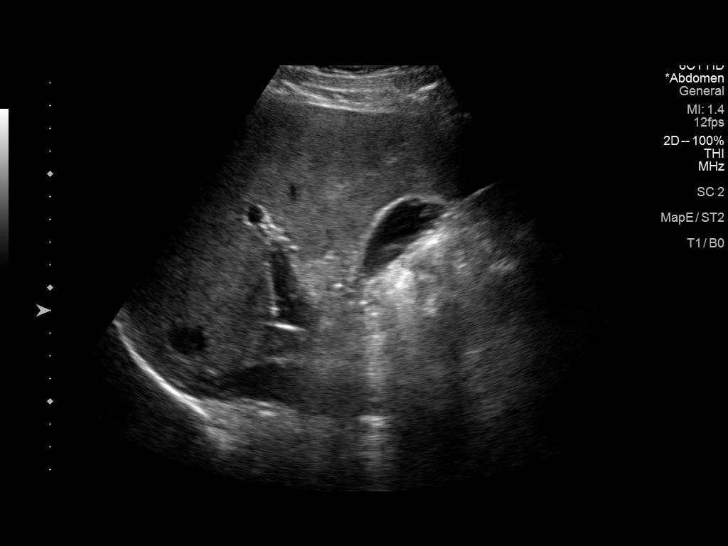
[im 33/87]
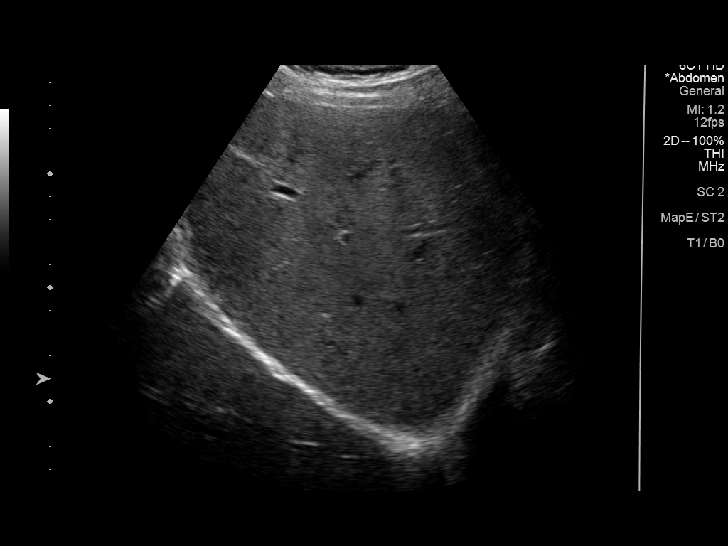
[im 40/87]
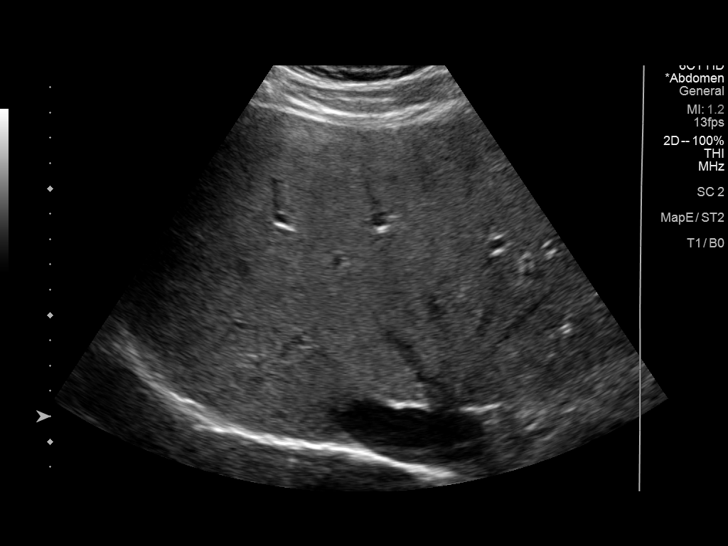
[im 47/87]
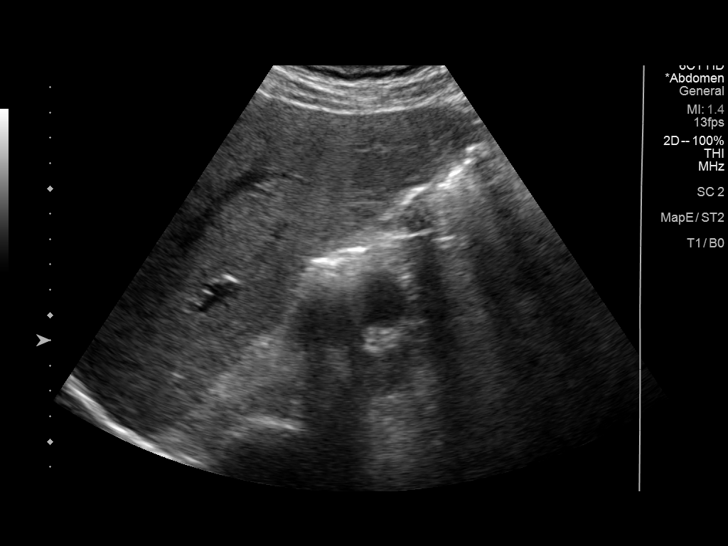
[im 54/87]
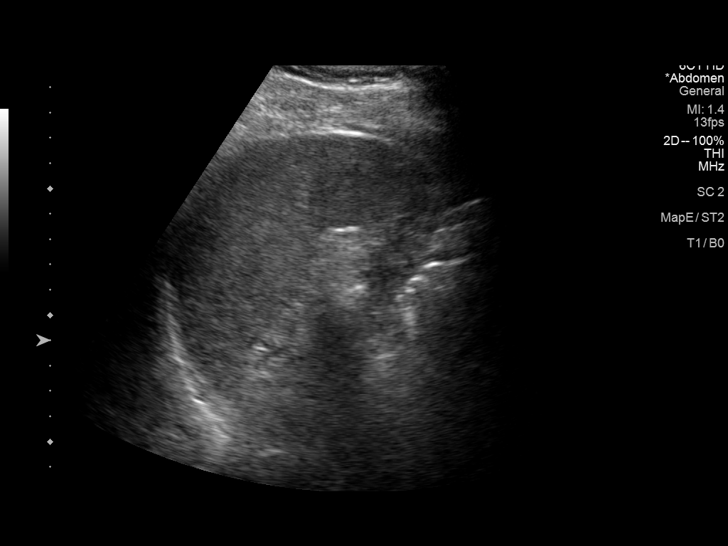
[im 58/87]
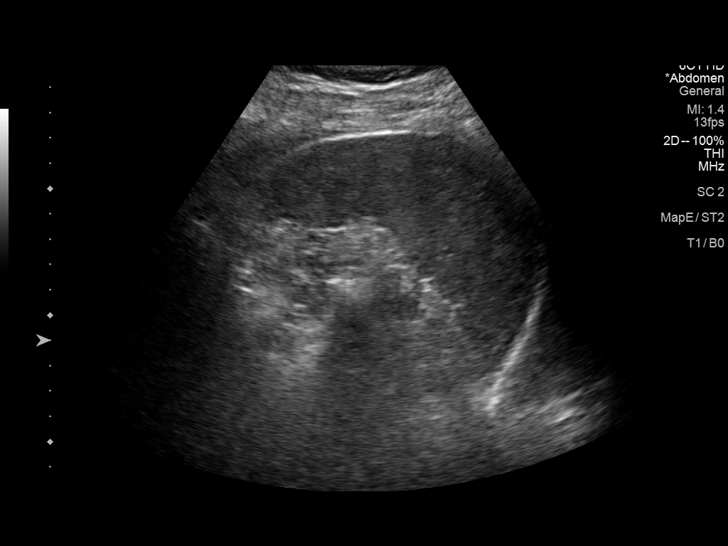
[im 65/87]
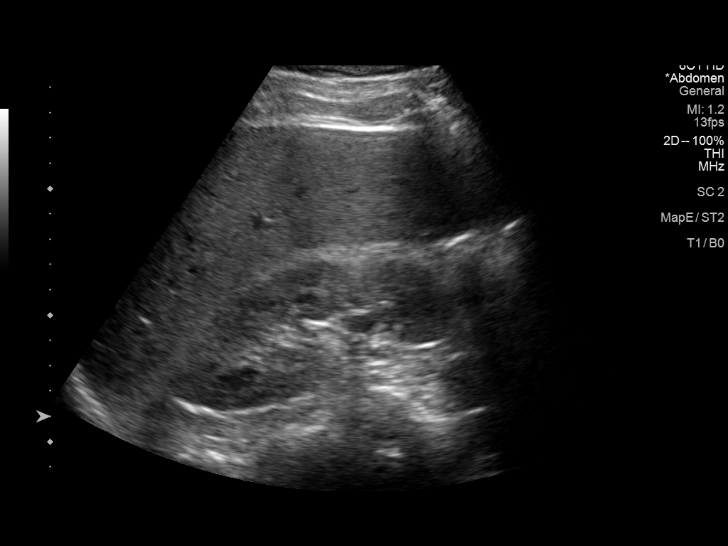
[im 72/87]
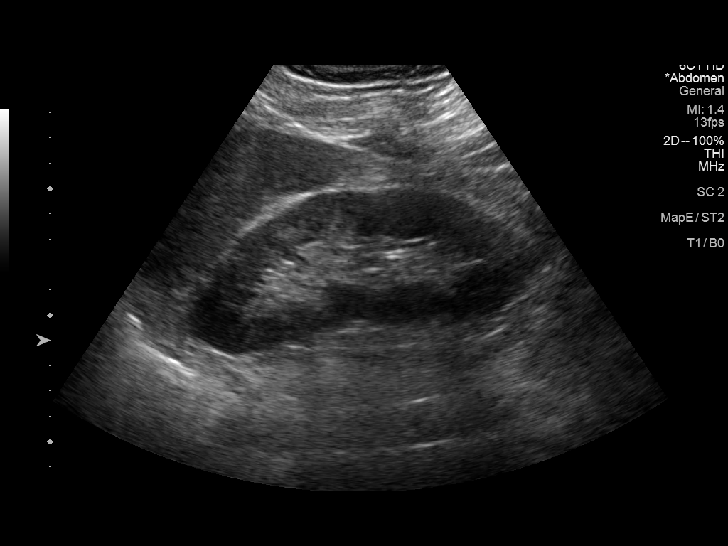
[im 79/87]
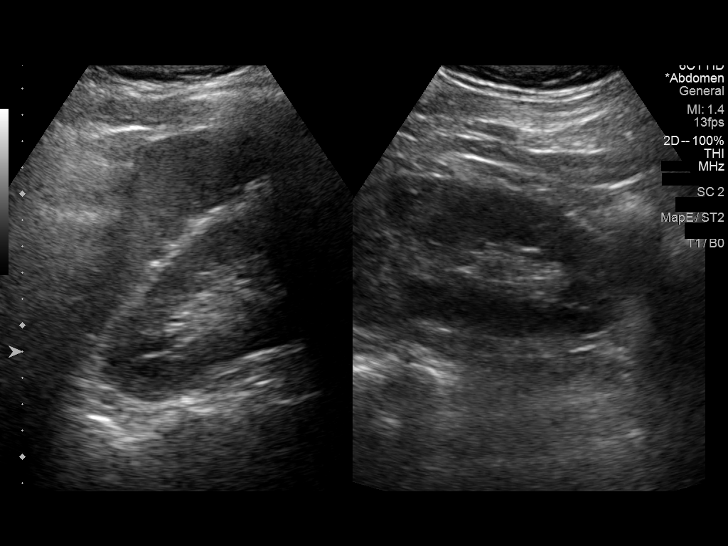
[im 87/87]
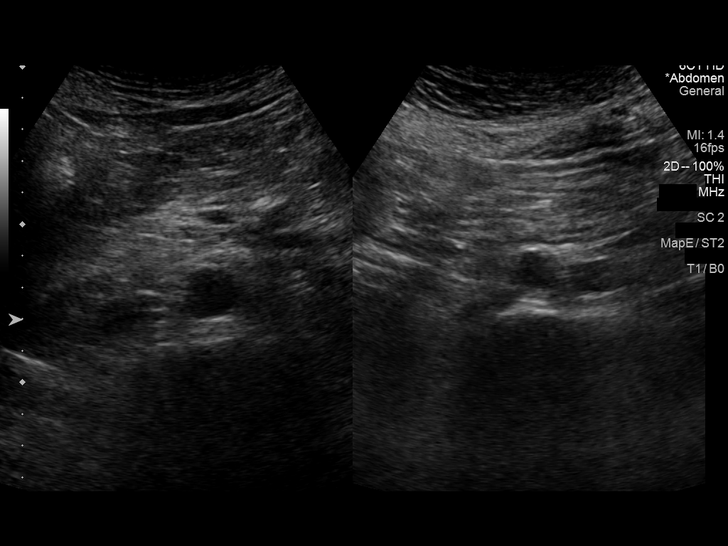

[14 of 25 positions shown; findings below may reference images not displayed]

FINDINGS: Gallbladder: No gallstones or wall thickening visualized. No
sonographic Murphy sign noted by sonographer.

Common bile duct: Diameter: 3 mm which is within normal limits.

Liver: No focal lesion identified. Mildly increased echogenicity of
hepatic parenchyma is noted suggesting hepatic steatosis or other
diffuse hepatocellular disease. Portal vein is patent on color
Doppler imaging with normal direction of blood flow towards the
liver.

IVC: No abnormality visualized.

Pancreas: Visualized portion unremarkable.

Spleen: Size and appearance within normal limits.

Right Kidney: Length: 13.4 cm. Echogenicity within normal limits. No
mass or hydronephrosis visualized.

Left Kidney: Length: 14.3 cm. Echogenicity within normal limits. No
mass or hydronephrosis visualized.

Abdominal aorta: No aneurysm visualized.

Other findings: None.
IMPRESSION: Mildly increased echogenicity of hepatic parenchyma is noted
suggesting hepatic steatosis or other diffuse hepatocellular
disease.

## 2020-05-24 ENCOUNTER — Other Ambulatory Visit: Payer: Self-pay | Admitting: Medical

## 2020-06-09 ENCOUNTER — Ambulatory Visit: Payer: BC Managed Care – PPO | Admitting: Gastroenterology

## 2020-06-15 ENCOUNTER — Ambulatory Visit: Payer: Self-pay | Admitting: General Surgery

## 2020-06-15 DIAGNOSIS — K603 Anal fistula: Secondary | ICD-10-CM | POA: Diagnosis not present

## 2020-06-15 NOTE — H&P (Signed)
The patient is a 39 year old male who presents with a complaint of anal problems. 39 year old male recovering alcoholic who presents to the office for evaluation of fecal leakage. He states that this is present in some form every day. Approximately, 2 years ago, he developed a lot of rectal bleeding which prompted his sobriety and a colonoscopy. Over the last few months to year, he has noticed drainage after bowel movements. He also occasionally has rectal pain with bowel movements. He reports formed bowel movements approximately 75% of the time.   Past Surgical History Darden Palmer, [place]; 06/15/2020 2:59 PM) Vasectomy  Diagnostic Studies History Darden Palmer, [place]; 06/15/2020 2:59 PM) Colonoscopy 1-5 years ago  Allergies Darden Palmer, [place]; 06/15/2020 3:00 PM) No Known Drug Allergies [06/15/2020]: Allergies Reconciled  Medication History Darden Palmer, [place]; 06/15/2020 3:03 PM) Rosuvastatin Calcium (40MG  Tablet, Oral) Active. hydroCHLOROthiazide (25MG  Tablet, Oral) Active. Omega-3-acid Ethyl Esters (1GM Capsule, Oral) Active. Vitamin D (Oral) Specific strength unknown - Active. EPINEPHrine (0.3MG /0.3ML Soln Auto-inj, Injection) Active. Medications Reconciled  Social History Darden Palmer, [place]; 06/15/2020 2:59 PM) Alcohol use Remotely quit alcohol use. Caffeine use Carbonated beverages, Coffee. Illicit drug use Remotely quit drug use. Tobacco use Current every day smoker.  Family History Darden Palmer, [place]; 06/15/2020 2:59 PM) Alcohol Abuse Father. Colon Polyps Father. Hypertension Father.  Other Problems Darden Palmer, [place]; 06/15/2020 2:59 PM) Alcohol Abuse High blood pressure Hypercholesterolemia     Review of Systems Lattie Haw Caldwell RMA; 06/15/2020 3:00 PM) General Not Present- Appetite Loss, Chills, Fatigue, Fever, Night Sweats, Weight Gain and Weight Loss. Skin Present- Dryness. Not Present- Change in Wart/Mole, Hives, Jaundice, New Lesions,  Non-Healing Wounds, Rash and Ulcer. HEENT Present- Seasonal Allergies. Not Present- Earache, Hearing Loss, Hoarseness, Nose Bleed, Oral Ulcers, Ringing in the Ears, Sinus Pain, Sore Throat, Visual Disturbances, Wears glasses/contact lenses and Yellow Eyes. Respiratory Not Present- Bloody sputum, Chronic Cough, Difficulty Breathing, Snoring and Wheezing. Breast Not Present- Breast Mass, Breast Pain, Nipple Discharge and Skin Changes. Cardiovascular Not Present- Chest Pain, Difficulty Breathing Lying Down, Leg Cramps, Palpitations, Rapid Heart Rate, Shortness of Breath and Swelling of Extremities. Gastrointestinal Present- Rectal Pain. Not Present- Abdominal Pain, Bloating, Bloody Stool, Change in Bowel Habits, Chronic diarrhea, Constipation, Difficulty Swallowing, Excessive gas, Gets full quickly at meals, Hemorrhoids, Indigestion, Nausea and Vomiting. Male Genitourinary Not Present- Blood in Urine, Change in Urinary Stream, Frequency, Impotence, Nocturia, Painful Urination, Urgency and Urine Leakage. Musculoskeletal Not Present- Back Pain, Joint Pain, Joint Stiffness, Muscle Pain, Muscle Weakness and Swelling of Extremities. Neurological Not Present- Decreased Memory, Fainting, Headaches, Numbness, Seizures, Tingling, Tremor, Trouble walking and Weakness. Hematology Not Present- Blood Thinners, Easy Bruising, Excessive bleeding, Gland problems, HIV and Persistent Infections.  Vitals Lattie Haw [place] RMA; 06/15/2020 3:03 PM) 06/15/2020 3:03 PM Weight: 247.25 lb Height: 74in Body Surface Area: 2.38 m Body Mass Index: 31.74 kg/m  Temp.: 98.12F  Pulse: 71 (Regular)  P.OX: 93% (Room air) BP: 120/68(Sitting, Left Arm, Standard)        Physical Exam Leighton Ruff MD; 0/25/4270 3:21 PM)  General Mental Status-Alert. General Appearance-Cooperative.  Rectal Note: Left posterior, shallow anal fistula. Left lateral anal skin tag    Assessment & Plan Leighton Ruff MD; 04/14/7627  3:18 PM)  ANAL FISTULA (K60.3) Impression: 39 year old male with a very shallow anal fistula, which is causing fecal leakage. I have recommended exam under anesthesia with fistulotomy and skin tag removal. We have discussed the typical postoperative pain and recovery times. We discussed the risk of recurrence. All questions  were answered.

## 2020-07-04 ENCOUNTER — Other Ambulatory Visit: Payer: Self-pay | Admitting: Medical

## 2020-07-05 DIAGNOSIS — L309 Dermatitis, unspecified: Secondary | ICD-10-CM | POA: Diagnosis not present

## 2020-07-26 ENCOUNTER — Encounter: Payer: Self-pay | Admitting: Medical

## 2020-07-26 ENCOUNTER — Other Ambulatory Visit: Payer: Self-pay

## 2020-07-26 ENCOUNTER — Ambulatory Visit (INDEPENDENT_AMBULATORY_CARE_PROVIDER_SITE_OTHER): Payer: BLUE CROSS/BLUE SHIELD | Admitting: Medical

## 2020-07-26 VITALS — BP 110/88 | HR 61 | Ht 74.0 in | Wt 247.0 lb

## 2020-07-26 DIAGNOSIS — R7989 Other specified abnormal findings of blood chemistry: Secondary | ICD-10-CM

## 2020-07-26 DIAGNOSIS — F1021 Alcohol dependence, in remission: Secondary | ICD-10-CM

## 2020-07-26 DIAGNOSIS — K644 Residual hemorrhoidal skin tags: Secondary | ICD-10-CM

## 2020-07-26 DIAGNOSIS — I10 Essential (primary) hypertension: Secondary | ICD-10-CM

## 2020-07-26 DIAGNOSIS — L84 Corns and callosities: Secondary | ICD-10-CM

## 2020-07-26 DIAGNOSIS — E785 Hyperlipidemia, unspecified: Secondary | ICD-10-CM | POA: Diagnosis not present

## 2020-07-26 DIAGNOSIS — K760 Fatty (change of) liver, not elsewhere classified: Secondary | ICD-10-CM | POA: Diagnosis not present

## 2020-07-26 NOTE — Progress Notes (Signed)
Established patient visit   Patient: Wesley Cruz   DOB: Jan 27, 1981   39 y.o. Male  MRN: [id number] Visit Date: 07/26/2020  Today's healthcare provider: Dorothea Ogle, PA-C   Chief Complaint  Patient presents with  . Follow-up    liver test and cholesterol check-fasting labs   . Hyperlipidemia  I,Wesley Cruz C Wesley Cruz,acting as a Education administrator for [employer], PA-C.,have documented all relevant documentation on the behalf of Wesley Ogle, PA-C,as directed by  [employer], PA-C while in the presence of [employer], PA-C.  Subjective    HPI HPI    Follow-up     Additional comments: liver test and cholesterol check-fasting labs        Last edited by Wesley Cruz C, CMA on 07/26/2020  8:38 AM. (History)      Follow up for elevated liver  Patient presents today for follow-up visit. He had US done on 03/08/20 that showed fatty liver disease.  Regarding elevated liver test, he is sober now since 2018.  Had about 16 years of heavy alcohol use.  No concern for hepatitis.  Doing well in general.  He does still eat quite a bit of meat.  He is exercising.   Lipid/Cholesterol, Follow-up  Last lipid panel Other pertinent labs  Lab Results  Component Value Date   CHOL 191 05/09/2020   HDL 38 (L) 05/09/2020   LDLCALC 138 (H) 05/09/2020   TRIG 81 05/09/2020   CHOLHDL 5.0 05/09/2020   Lab Results  Component Value Date   ALT 29 05/09/2020   AST 18 05/09/2020   PLT 239 05/09/2020   TSH 1.764 09/21/2013     He was last seen for this 3 months ago. Patient was advised to restarted his Wesley Cruz 40 MG and reports good compliance with treatment. He reports eating a low fat diet.   Foot corn Patient has a foot corn to the bottom of his left foot.  He wants to do another shave like we did back in March which helped.    Medications: Outpatient Medications Prior to Visit  Medication Sig  . cholecalciferol (VITAMIN D) 1000 units tablet Take 1 tablet (1,000 Units total) by  mouth daily. (Patient taking differently: Take 5,000 Units by mouth daily. )  . hydrochlorothiazide (HYDRODIURIL) 25 MG tablet TAKE 1 TABLET(25 MG) BY MOUTH DAILY  . omega-3 acid ethyl esters (LOVAZA) 1 g capsule TAKE 2 CAPSULES BY MOUTH TWICE DAILY  . rosuvastatin (Wesley Cruz) 40 MG tablet TAKE 1 TABLET(40 MG) BY MOUTH DAILY  . EPINEPHrine (EPIPEN 2-PAK) 0.3 mg/0.3 mL IJ SOAJ injection Inject 0.3 mLs (0.3 mg total) into the muscle once as needed (for severe allergic reaction). CAll 911 immediately if you have to use this medicine (Patient not taking: Reported on 03/02/2020)   No facility-administered medications prior to visit.    Review of Systems    Objective    BP 110/88   Pulse 61   Ht 6\' 2"  (1.88 m)   Wt 247 lb (112 kg)   SpO2 97%   BMI 31.71 kg/m    Physical Exam Constitutional:      Appearance: Normal appearance. He is obese.  Cardiovascular:     Pulses: Normal pulses.  Musculoskeletal:     Right lower leg: No edema.     Left lower leg: No edema.  Skin:    General: Skin is warm and dry.     Findings: Lesion (Left foot volar surface MTP of three toe with a 54mm diam.  corn, mildly tender.) present.  Neurological:     Mental Status: He is alert.        Assessment & Plan     Encounter Diagnoses  Name Primary?  . Corn of foot Yes  . Essential hypertension   . Fatty liver   . Elevated LFTs   . Hyperlipidemia, unspecified hyperlipidemia type   . Recovering alcoholic ([place])   . Anal skin tag     Corn of foot-with his consent and request I used a sterile bendable razor to shave off dead surface skin over the corn of his left volar foot, use Verruca-Freeze cryotherapy to treat the lesion.  We discussed wound care.  Cover the lesion with a Band-Aid.  If he does not feel like the corner has completely resolved over the next month and a half then we would refer to dermatology for other management  Hypertension-continue current medication  Fatty liver disease, elevated  LFTs-ultrasound from May shows fatty liver deposits.  His liver enzymes were back to normal in July 2021.  Counseled on the need to continue low-fat low-cholesterol diet, regular exercise and try to maintain a more normal BMI  Hyperlipidemia-she has restarted Wesley Cruz since last visit doing fine on current therapy  Anal skin tag-he has upcoming surgery for general surgery to repair an anal fissure/fistula and remove anal skin tag. I wished him well with surgery  Get your flu shot at work free soon   Wesley Cruz, [place] [phone] (phone) [phone] (fax)  [place]

## 2020-08-03 ENCOUNTER — Encounter: Payer: Self-pay | Admitting: Gastroenterology

## 2020-08-03 ENCOUNTER — Ambulatory Visit: Payer: BC Managed Care – PPO | Admitting: Gastroenterology

## 2020-08-03 VITALS — BP 122/80 | HR 68 | Ht 76.0 in | Wt 245.0 lb

## 2020-08-03 DIAGNOSIS — R945 Abnormal results of liver function studies: Secondary | ICD-10-CM | POA: Diagnosis not present

## 2020-08-03 DIAGNOSIS — R7989 Other specified abnormal findings of blood chemistry: Secondary | ICD-10-CM

## 2020-08-03 DIAGNOSIS — K760 Fatty (change of) liver, not elsewhere classified: Secondary | ICD-10-CM

## 2020-08-03 NOTE — Progress Notes (Signed)
[facility] Consult Note:  History: Wesley Cruz 08/03/2020  Referring provider: Carlena Hurl, PA-C  Reason for consult/chief complaint: Elevated Hepatic Enzymes (Elevated LFTs was sent here by Dr.Hung )   Subjective  HPI:  This is a very pleasant 39 year old man referred by his primary care provider several months ago for elevated LFTs.  Wesley Cruz reports that this was an issue several years ago when he was engaging in heavy alcohol and drug use.  He saw Dr. Juanita Craver who did what sounds like upper endoscopy and colonoscopy because of some rectal bleeding and other symptoms.  Wesley Cruz and has been abstinent from drugs and alcohol for a few years now.  He has had a persistent mild LFT elevation including in May of this year. He saw Dr. Carol Ada sometime in the last several months because he was having a perianal discomfort and protrusion.  Dr. Benson Norway has sent him to Dr. Marcello Moores of colorectal surgery who is planning a fistulotomy in several weeks.  Wesley Cruz was therefore unsure why he got referred to another GI physician but was happy to see this and discuss his condition today.  He admits to consuming too many carbs and knows he needs to get more cardiovascular exercise.  He had been told by Dr. Collene Mares years ago that he had an alcohol-related fatty liver, so he was concerned to learn that his liver labs are still elevated and fatty liver was still seen on ultrasound despite his abstinence.  ROS:  Review of Systems  Constitutional: Negative for appetite change and unexpected weight change.  HENT: Negative for mouth sores and voice change.   Eyes: Negative for pain and redness.  Respiratory: Negative for cough and shortness of breath.   Cardiovascular: Negative for chest pain and palpitations.  Genitourinary: Negative for dysuria and hematuria.  Musculoskeletal: Negative for arthralgias and myalgias.  Skin: Negative for pallor and rash.    Neurological: Negative for weakness and headaches.  Hematological: Negative for adenopathy.     Past Medical History: Past Medical History:  Diagnosis Date  . Allergy   . Anxiety   . Bee sting-induced anaphylaxis 2013  . Elevated LFTs    2017 - 2021 ; abnormal [place], negative hepatitis infection 2017 labs  . History of alcohol abuse    16 years of drinking; went through residential treatment program 2018  . Hyperlipidemia 6/15  . Hypertension 6/15  . Laceration 2009   right hand  . Seizure ([place]) 2009   questionable seizure, syncope, one episode, none since - seen at [facility] ED  . Tobacco use      Past Surgical History: Past Surgical History:  Procedure Laterality Date  . LACERATION REPAIR  2009   right dorsal hand; s/p punching wall     Family History: Family History  Problem Relation Age of Onset  . Hypertension Father   . Hypertension Paternal Uncle   . Stroke Paternal Uncle 32  . Cancer Paternal Grandmother        lung?  Marland Kitchen COPD Paternal Grandmother   . COPD Paternal Grandfather   . Stroke Paternal Uncle 102  . Heart disease Neg Hx   . Diabetes Neg Hx     Social History: Social History   Socioeconomic History  . Marital status: Married    Spouse name: Not on file  . Number of children: Not on file  . Years of education: Not on file  . Highest education level: Not on file  Occupational History  . Not on file  Tobacco Use  . Smoking status: Current Every Day Smoker    Packs/day: 0.50    Years: 14.00    Pack years: 7.00    Types: Cigarettes  . Smokeless tobacco: Never Used  Vaping Use  . Vaping Use: Never used  Substance and Sexual Activity  . Alcohol use: No    Comment: significant in past, went through Cruz program 2018  . Drug use: No  . Sexual activity: Not on file  Other Topics Concern  . Not on file  Social History Narrative   Married, has dogs, 2 children, exercise - walking.   Working at [employer], [place]  for [employer].  [employer]. 04/2020   Social Determinants of Health   Financial Resource Strain:   . Difficulty of Paying Living Expenses: Not on file  Food Insecurity:   . Worried About Charity fundraiser in the Last Year: Not on file  . Ran Out of Food in the Last Year: Not on file  Transportation Needs:   . Lack of Transportation (Medical): Not on file  . Lack of Transportation (Non-Medical): Not on file  Physical Activity:   . Days of Exercise per Week: Not on file  . Minutes of Exercise per Session: Not on file  Stress:   . Feeling of Stress : Not on file  Social Connections:   . Frequency of Communication with Friends and Family: Not on file  . Frequency of Social Gatherings with Friends and Family: Not on file  . Attends Religious Services: Not on file  . Active Member of Clubs or Organizations: Not on file  . Attends Archivist Meetings: Not on file  . Marital Status: Not on file    Allergies: Allergies  Allergen Reactions  . Hornet Venom Anaphylaxis    Japanese hornet  . Lisinopril     cough    Outpatient Meds: Current Outpatient Medications  Medication Sig Dispense Refill  . cholecalciferol (VITAMIN D) 1000 units tablet Take 1 tablet (1,000 Units total) by mouth daily. (Patient taking differently: Take 5,000 Units by mouth daily. ) 30 tablet 11  . EPINEPHrine (EPIPEN 2-PAK) 0.3 mg/0.3 mL IJ SOAJ injection Inject 0.3 mLs (0.3 mg total) into the muscle once as needed (for severe allergic reaction). CAll 911 immediately if you have to use this medicine 1 each 1  . hydrochlorothiazide (HYDRODIURIL) 25 MG tablet TAKE 1 TABLET(25 MG) BY MOUTH DAILY 30 tablet 10  . omega-3 acid ethyl esters (LOVAZA) 1 g capsule TAKE 2 CAPSULES BY MOUTH TWICE DAILY 120 capsule 10  . rosuvastatin (CRESTOR) 40 MG tablet TAKE 1 TABLET(40 MG) BY MOUTH DAILY 90 tablet 1   No current facility-administered medications for this visit.       ___________________________________________________________________ Objective   Exam:  BP 122/80   Pulse 68   Ht 6\' 4"  (1.93 m)   Wt 245 lb (111.1 kg)   BMI 29.82 kg/m    General: Well-appearing  Eyes: sclera anicteric, no redness  ENT: oral mucosa moist without lesions, no cervical or supraclavicular lymphadenopathy  CV: RRR without murmur, S1/S2, no JVD, no peripheral edema  Resp: clear to auscultation bilaterally, normal RR and effort noted  GI: soft, no tenderness, with active bowel sounds.  Liver edge just palpable on deep inspiration  Skin; warm and dry, no rash or jaundice noted  Neuro: awake, alert and oriented x 3. Normal gross motor function and fluent  speech  Labs:  CBC Latest Ref Rng & Units 05/09/2020 03/24/2019 03/18/2018  WBC 3.4 - 10.8 x10E3/uL 7.8 7.9 9.6  Hemoglobin 13.0 - 17.7 g/dL 15.6 16.2 16.1  Hematocrit 37.5 - 51.0 % 45.3 47.1 48.1  Platelets 150 - 450 x10E3/uL 239 244 249     CMP Latest Ref Rng & Units 05/09/2020 03/02/2020 03/24/2019  Glucose 65 - 99 mg/dL 94 - 94  BUN 6 - 20 mg/dL 11 - 11  Creatinine 0.76 - 1.27 mg/dL 0.77 - 0.85  Sodium 134 - 144 mmol/L 140 - 140  Potassium 3.5 - 5.2 mmol/L 4.5 - 4.3  Chloride 96 - 106 mmol/L 104 - 100  CO2 20 - 29 mmol/L 22 - 23  Calcium 8.7 - 10.2 mg/dL 9.6 - 9.9  Total Protein 6.0 - 8.5 g/dL 6.8 7.3 7.2  Total Bilirubin 0.0 - 1.2 mg/dL 0.8 0.6 0.7  Alkaline Phos 48 - 121 IU/L 61 66 65  AST 0 - 40 IU/L 18 31 24   ALT 0 - 44 IU/L 29 60(H) 40   Iron/TIBC/Ferritin/ %Sat    Component Value Date/Time   IRON 109 05/09/2020 0929   Lipid Panel     Component Value Date/Time   CHOL 191 05/09/2020 0929   TRIG 81 05/09/2020 0929   HDL 38 (L) 05/09/2020 0929   CHOLHDL 5.0 05/09/2020 0929   CHOLHDL 3.6 04/19/2017 1025   VLDL 18 04/19/2017 1025   LDLCALC 138 (H) 05/09/2020 0929   LABVLDL 15 05/09/2020 0929   Last hemoglobin A1c 5.4 in June 2020  Transaminases about 200 in 2017, then persistent  modest ALT elevation 50-60 since then including May of this year.  Negative acute hepatitis panel October 2017  Radiologic Studies: Right upper quadrant ultrasound November 2017 showing increased hepatic echogenicity consistent with fatty liver.  Patient was referred to Dr. Adriana Mccallum  __________________________________   CLINICAL DATA:  Elevated liver function tests.   EXAM: ABDOMEN ULTRASOUND COMPLETE   COMPARISON:  August 28, 2016.   FINDINGS: Gallbladder: No gallstones or wall thickening visualized. No sonographic Murphy sign noted by sonographer.   Common bile duct: Diameter: 3 mm which is within normal limits.   Liver: No focal lesion identified. Mildly increased echogenicity of hepatic parenchyma is noted suggesting hepatic steatosis or other diffuse hepatocellular disease. Portal vein is patent on color Doppler imaging with normal direction of blood flow towards the liver.   IVC: No abnormality visualized.   Pancreas: Visualized portion unremarkable.   Spleen: Size and appearance within normal limits.   Right Kidney: Length: 13.4 cm. Echogenicity within normal limits. No mass or hydronephrosis visualized.   Left Kidney: Length: 14.3 cm. Echogenicity within normal limits. No mass or hydronephrosis visualized.   Abdominal aorta: No aneurysm visualized.   Other findings: None.   IMPRESSION: Mildly increased echogenicity of hepatic parenchyma is noted suggesting hepatic steatosis or other diffuse hepatocellular disease.     Electronically Signed   By: Marijo Conception M.D.   On: 03/08/2020 13:34   Assessment: Encounter Diagnoses  Name Primary?  Marland Kitchen LFTs abnormal Yes  . NAFL (nonalcoholic fatty liver)     It appears that years ago he originally had alcohol-related fatty liver, and now has mild persistent metabolic associated fatty liver.  He has a modest ALT elevation that normalized on last check in July. We discussed the nature of this condition and  the need for decrease carbohydrate and sugar intake along with cardiovascular exercise and weight loss.  Plan: I  recommend a primary care check is liver labs about every 6 months. With the next check, after he has had a period of diet and lifestyle changes, also check a full iron panel because if the saturation is elevated, he should have genetic testing for hemochromatosis.  I can see Wesley Cruz back if needed and if he wants to transfer all of his GI care to our practice.  Thank you for the courtesy of this consult.  Please call me with any questions or concerns.  Nelida Meuse III  CC: Referring provider noted above

## 2020-08-03 NOTE — Patient Instructions (Signed)
If you are age 39 or older, your body mass index should be between 23-30. Your Body mass index is 29.82 kg/m. If this is out of the aforementioned range listed, please consider follow up with your Primary Care Provider.  If you are age 72 or younger, your body mass index should be between 19-25. Your Body mass index is 29.82 kg/m. If this is out of the aformentioned range listed, please consider follow up with your Primary Care Provider.   It was a pleasure to see you today!  Dr. Loletha Carrow

## 2020-08-11 DIAGNOSIS — Z1152 Encounter for screening for COVID-19: Secondary | ICD-10-CM | POA: Diagnosis not present

## 2020-08-23 ENCOUNTER — Encounter (HOSPITAL_BASED_OUTPATIENT_CLINIC_OR_DEPARTMENT_OTHER): Payer: Self-pay | Admitting: General Surgery

## 2020-08-24 ENCOUNTER — Encounter (HOSPITAL_BASED_OUTPATIENT_CLINIC_OR_DEPARTMENT_OTHER): Payer: Self-pay | Admitting: General Surgery

## 2020-08-24 ENCOUNTER — Other Ambulatory Visit: Payer: Self-pay

## 2020-08-24 NOTE — Progress Notes (Signed)
Spoke w/ via phone for pre-op interview---pt Lab needs dos----  I stat 8, ekg             Lab results------none lov dr Loletha Carrow 08-03-2020 epic COVID test ------08-29-2020 830 Arrive at -------530 am 09-01-2020 NPO after MN NO Solid Food.  Clear liquids from MN until---430 am then npo Medications to take morning of surgery -----pt  Wishes to take no meds am of surgery Diabetic medication -----n/a Patient Special Instructions -----none Pre-Op special Istructions -----none Patient verbalized understanding of instructions that were given at this phone interview. Patient denies shortness of breath, chest pain, fever, cough at this phone interview.

## 2020-08-29 ENCOUNTER — Other Ambulatory Visit (HOSPITAL_COMMUNITY): Payer: BC Managed Care – PPO

## 2020-08-30 ENCOUNTER — Other Ambulatory Visit (HOSPITAL_COMMUNITY)
Admission: RE | Admit: 2020-08-30 | Discharge: 2020-08-30 | Disposition: A | Discharge status: Home / Self Care | Payer: BC Managed Care – PPO | Source: Ambulatory Visit | Attending: General Surgery | Admitting: General Surgery

## 2020-08-30 DIAGNOSIS — Z8249 Family history of ischemic heart disease and other diseases of the circulatory system: Secondary | ICD-10-CM | POA: Diagnosis not present

## 2020-08-30 DIAGNOSIS — Z6831 Body mass index (BMI) 31.0-31.9, adult: Secondary | ICD-10-CM | POA: Diagnosis not present

## 2020-08-30 DIAGNOSIS — F17200 Nicotine dependence, unspecified, uncomplicated: Secondary | ICD-10-CM | POA: Diagnosis not present

## 2020-08-30 DIAGNOSIS — Z20822 Contact with and (suspected) exposure to covid-19: Secondary | ICD-10-CM | POA: Insufficient documentation

## 2020-08-30 DIAGNOSIS — E669 Obesity, unspecified: Secondary | ICD-10-CM | POA: Diagnosis not present

## 2020-08-30 DIAGNOSIS — I10 Essential (primary) hypertension: Secondary | ICD-10-CM | POA: Diagnosis not present

## 2020-08-30 DIAGNOSIS — Z01812 Encounter for preprocedural laboratory examination: Secondary | ICD-10-CM | POA: Insufficient documentation

## 2020-08-30 DIAGNOSIS — Z811 Family history of alcohol abuse and dependence: Secondary | ICD-10-CM | POA: Diagnosis not present

## 2020-08-30 DIAGNOSIS — Z8371 Family history of colonic polyps: Secondary | ICD-10-CM | POA: Diagnosis not present

## 2020-08-30 DIAGNOSIS — K641 Second degree hemorrhoids: Secondary | ICD-10-CM | POA: Diagnosis not present

## 2020-08-30 DIAGNOSIS — Z79899 Other long term (current) drug therapy: Secondary | ICD-10-CM | POA: Diagnosis not present

## 2020-08-30 DIAGNOSIS — L918 Other hypertrophic disorders of the skin: Secondary | ICD-10-CM | POA: Diagnosis not present

## 2020-08-30 DIAGNOSIS — K603 Anal fistula: Secondary | ICD-10-CM | POA: Diagnosis not present

## 2020-08-30 DIAGNOSIS — E7800 Pure hypercholesterolemia, unspecified: Secondary | ICD-10-CM | POA: Diagnosis not present

## 2020-08-30 LAB — SARS CORONAVIRUS 2 (TAT 6-24 HRS): SARS Coronavirus 2: NEGATIVE

## 2020-08-31 NOTE — Anesthesia Preprocedure Evaluation (Addendum)
Anesthesia Evaluation    Reviewed: Allergy & Precautions, Patient's Chart, lab work & pertinent test results, Unable to perform ROS - Chart review only  Airway Mallampati: II  TM Distance: >3 FB Neck ROM: Full    Dental  (+) Dental Advisory Given, Teeth Intact   Pulmonary Current Smoker,    Pulmonary exam normal breath sounds clear to auscultation       Cardiovascular hypertension, Pt. on medications Normal cardiovascular exam Rhythm:Regular Rate:Normal     Neuro/Psych Seizures -,  PSYCHIATRIC DISORDERS    GI/Hepatic negative GI ROS, (+)     substance abuse  alcohol use,   Endo/Other  negative endocrine ROS  Renal/GU negative Renal ROS     Musculoskeletal  (+) Arthritis ,   Abdominal (+) + obese,   Peds  Hematology negative hematology ROS (+)   Anesthesia Other Findings   Reproductive/Obstetrics                            Anesthesia Physical Anesthesia Plan  ASA: II  Anesthesia Plan: MAC   Post-op Pain Management:    Induction: Intravenous  PONV Risk Score and Plan: 2 and Ondansetron, Propofol infusion, Treatment may vary due to age or medical condition, Midazolam and TIVA  Airway Management Planned: Natural Airway  Additional Equipment:   Intra-op Plan:   Post-operative Plan:   Informed Consent: I have reviewed the patients History and Physical, chart, labs and discussed the procedure including the risks, benefits and alternatives for the proposed anesthesia with the patient or authorized representative who has indicated his/her understanding and acceptance.     Dental advisory given  Plan Discussed with: CRNA  Anesthesia Plan Comments:        Anesthesia Quick Evaluation

## 2020-09-01 ENCOUNTER — Other Ambulatory Visit: Payer: Self-pay

## 2020-09-01 ENCOUNTER — Ambulatory Visit (HOSPITAL_BASED_OUTPATIENT_CLINIC_OR_DEPARTMENT_OTHER): Payer: BC Managed Care – PPO | Admitting: Anesthesiology

## 2020-09-01 ENCOUNTER — Encounter (HOSPITAL_BASED_OUTPATIENT_CLINIC_OR_DEPARTMENT_OTHER)
Admission: RE | Disposition: A | Discharge status: Home / Self Care | Payer: Self-pay | Source: Home / Self Care | Attending: General Surgery

## 2020-09-01 ENCOUNTER — Ambulatory Visit (HOSPITAL_BASED_OUTPATIENT_CLINIC_OR_DEPARTMENT_OTHER)
Admission: RE | Admit: 2020-09-01 | Discharge: 2020-09-01 | Disposition: A | Discharge status: Home / Self Care | Payer: BC Managed Care – PPO | Attending: General Surgery | Admitting: General Surgery

## 2020-09-01 ENCOUNTER — Encounter (HOSPITAL_BASED_OUTPATIENT_CLINIC_OR_DEPARTMENT_OTHER): Payer: Self-pay | Admitting: General Surgery

## 2020-09-01 DIAGNOSIS — Z20822 Contact with and (suspected) exposure to covid-19: Secondary | ICD-10-CM | POA: Diagnosis not present

## 2020-09-01 DIAGNOSIS — L918 Other hypertrophic disorders of the skin: Secondary | ICD-10-CM | POA: Diagnosis not present

## 2020-09-01 DIAGNOSIS — Z8371 Family history of colonic polyps: Secondary | ICD-10-CM | POA: Diagnosis not present

## 2020-09-01 DIAGNOSIS — K641 Second degree hemorrhoids: Secondary | ICD-10-CM | POA: Insufficient documentation

## 2020-09-01 DIAGNOSIS — Z811 Family history of alcohol abuse and dependence: Secondary | ICD-10-CM | POA: Insufficient documentation

## 2020-09-01 DIAGNOSIS — Z79899 Other long term (current) drug therapy: Secondary | ICD-10-CM | POA: Diagnosis not present

## 2020-09-01 DIAGNOSIS — K603 Anal fistula: Secondary | ICD-10-CM | POA: Diagnosis not present

## 2020-09-01 DIAGNOSIS — F17200 Nicotine dependence, unspecified, uncomplicated: Secondary | ICD-10-CM | POA: Diagnosis not present

## 2020-09-01 DIAGNOSIS — E7800 Pure hypercholesterolemia, unspecified: Secondary | ICD-10-CM | POA: Insufficient documentation

## 2020-09-01 DIAGNOSIS — Z8249 Family history of ischemic heart disease and other diseases of the circulatory system: Secondary | ICD-10-CM | POA: Insufficient documentation

## 2020-09-01 DIAGNOSIS — E669 Obesity, unspecified: Secondary | ICD-10-CM | POA: Diagnosis not present

## 2020-09-01 DIAGNOSIS — Z6831 Body mass index (BMI) 31.0-31.9, adult: Secondary | ICD-10-CM | POA: Diagnosis not present

## 2020-09-01 DIAGNOSIS — I10 Essential (primary) hypertension: Secondary | ICD-10-CM | POA: Insufficient documentation

## 2020-09-01 DIAGNOSIS — E559 Vitamin D deficiency, unspecified: Secondary | ICD-10-CM | POA: Diagnosis not present

## 2020-09-01 DIAGNOSIS — E785 Hyperlipidemia, unspecified: Secondary | ICD-10-CM | POA: Diagnosis not present

## 2020-09-01 HISTORY — PX: RECTAL EXAM UNDER ANESTHESIA: SHX6399

## 2020-09-01 HISTORY — PX: EXCISION OF SKIN TAG: SHX6270

## 2020-09-01 HISTORY — DX: Residual hemorrhoidal skin tags: K64.4

## 2020-09-01 HISTORY — PX: FISTULOTOMY: SHX6413

## 2020-09-01 LAB — POCT I-STAT, CHEM 8
BUN: 11 mg/dL (ref 6–20)
Calcium, Ion: 1.23 mmol/L (ref 1.15–1.40)
Chloride: 103 mmol/L (ref 98–111)
Creatinine, Ser: 0.7 mg/dL (ref 0.61–1.24)
Glucose, Bld: 110 mg/dL — ABNORMAL HIGH (ref 70–99)
HCT: 46 % (ref 39.0–52.0)
Hemoglobin: 15.6 g/dL (ref 13.0–17.0)
Potassium: 3.8 mmol/L (ref 3.5–5.1)
Sodium: 142 mmol/L (ref 135–145)
TCO2: 23 mmol/L (ref 22–32)

## 2020-09-01 SURGERY — FISTULOTOMY
Anesthesia: Monitor Anesthesia Care | Site: Rectum

## 2020-09-01 MED ORDER — FENTANYL CITRATE (PF) 100 MCG/2ML IJ SOLN: 25.0000 ug | INTRAMUSCULAR | Status: DC | PRN

## 2020-09-01 MED ORDER — PROPOFOL 10 MG/ML IV BOLUS
INTRAVENOUS | Status: AC
  Filled 2020-09-01: qty 20

## 2020-09-01 MED ORDER — DEXMEDETOMIDINE (PRECEDEX) IN NS 20 MCG/5ML (4 MCG/ML) IV SYRINGE
PREFILLED_SYRINGE | INTRAVENOUS | Status: DC | PRN
  Administered 2020-09-01: 8 ug via INTRAVENOUS

## 2020-09-01 MED ORDER — BUPIVACAINE LIPOSOME 1.3 % IJ SUSP
INTRAMUSCULAR | Status: DC | PRN
  Administered 2020-09-01: 20 mL

## 2020-09-01 MED ORDER — OXYCODONE HCL 5 MG PO TABS: 5.0000 mg | ORAL_TABLET | Freq: Once | ORAL | Status: DC | PRN

## 2020-09-01 MED ORDER — LIDOCAINE 2% (20 MG/ML) 5 ML SYRINGE
INTRAMUSCULAR | Status: AC
  Filled 2020-09-01: qty 5

## 2020-09-01 MED ORDER — MIDAZOLAM HCL 5 MG/5ML IJ SOLN
INTRAMUSCULAR | Status: DC | PRN
  Administered 2020-09-01: 2 mg via INTRAVENOUS

## 2020-09-01 MED ORDER — FENTANYL CITRATE (PF) 250 MCG/5ML IJ SOLN
INTRAMUSCULAR | Status: DC | PRN
  Administered 2020-09-01: 50 ug via INTRAVENOUS

## 2020-09-01 MED ORDER — LACTATED RINGERS IV SOLN: INTRAVENOUS | Status: DC

## 2020-09-01 MED ORDER — DEXMEDETOMIDINE (PRECEDEX) IN NS 20 MCG/5ML (4 MCG/ML) IV SYRINGE
PREFILLED_SYRINGE | INTRAVENOUS | Status: AC
  Filled 2020-09-01: qty 5

## 2020-09-01 MED ORDER — TRAMADOL HCL 50 MG PO TABS
50.0000 mg | ORAL_TABLET | Freq: Four times a day (QID) | ORAL | 0 refills | Status: DC | PRN
Start: 2020-09-01 — End: 2020-10-20

## 2020-09-01 MED ORDER — PROPOFOL 500 MG/50ML IV EMUL
INTRAVENOUS | Status: DC | PRN
  Administered 2020-09-01: 200 ug/kg/min via INTRAVENOUS

## 2020-09-01 MED ORDER — KETAMINE HCL 10 MG/ML IJ SOLN
INTRAMUSCULAR | Status: DC | PRN
  Administered 2020-09-01: 30 mg via INTRAVENOUS

## 2020-09-01 MED ORDER — DEXAMETHASONE SODIUM PHOSPHATE 10 MG/ML IJ SOLN
INTRAMUSCULAR | Status: DC | PRN
  Administered 2020-09-01: 5 mg via INTRAVENOUS

## 2020-09-01 MED ORDER — ONDANSETRON HCL 4 MG/2ML IJ SOLN
INTRAMUSCULAR | Status: AC
  Filled 2020-09-01: qty 2

## 2020-09-01 MED ORDER — MIDAZOLAM HCL 2 MG/2ML IJ SOLN
INTRAMUSCULAR | Status: AC
  Filled 2020-09-01: qty 2

## 2020-09-01 MED ORDER — BUPIVACAINE-EPINEPHRINE 0.5% -1:200000 IJ SOLN
INTRAMUSCULAR | Status: DC | PRN
  Administered 2020-09-01: 50 mL

## 2020-09-01 MED ORDER — FENTANYL CITRATE (PF) 250 MCG/5ML IJ SOLN
INTRAMUSCULAR | Status: AC
  Filled 2020-09-01: qty 5

## 2020-09-01 MED ORDER — PROMETHAZINE HCL 25 MG/ML IJ SOLN: 6.2500 mg | INTRAMUSCULAR | Status: DC | PRN

## 2020-09-01 MED ORDER — OXYCODONE HCL 5 MG/5ML PO SOLN: 5.0000 mg | Freq: Once | ORAL | Status: DC | PRN

## 2020-09-01 MED ORDER — KETAMINE HCL 10 MG/ML IJ SOLN
INTRAMUSCULAR | Status: AC
  Filled 2020-09-01: qty 1

## 2020-09-01 MED ORDER — FENTANYL CITRATE (PF) 100 MCG/2ML IJ SOLN
INTRAMUSCULAR | Status: AC
  Filled 2020-09-01: qty 2

## 2020-09-01 MED ORDER — PROPOFOL 10 MG/ML IV BOLUS
INTRAVENOUS | Status: DC | PRN
  Administered 2020-09-01: 30 mg via INTRAVENOUS

## 2020-09-01 MED ORDER — SODIUM CHLORIDE 0.9% FLUSH: 3.0000 mL | Freq: Two times a day (BID) | INTRAVENOUS | Status: DC

## 2020-09-01 MED ORDER — MEPERIDINE HCL 25 MG/ML IJ SOLN: 6.2500 mg | INTRAMUSCULAR | Status: DC | PRN

## 2020-09-01 SURGICAL SUPPLY — 64 items
ADH SKN CLS APL DERMABOND .7 (GAUZE/BANDAGES/DRESSINGS) ×2
APL PRP STRL LF DISP 70% ISPRP (MISCELLANEOUS)
APL SKNCLS STERI-STRIP NONHPOA (GAUZE/BANDAGES/DRESSINGS) ×4
BENZOIN TINCTURE PRP APPL 2/3 (GAUZE/BANDAGES/DRESSINGS) ×6 IMPLANT
BLADE CLIPPER SENSICLIP SURGIC (BLADE) IMPLANT
BLADE EXTENDED COATED 6.5IN (ELECTRODE) IMPLANT
BLADE HEX COATED 2.75 (ELECTRODE) ×3 IMPLANT
BLADE SURG 10 STRL SS (BLADE) IMPLANT
BLADE SURG 15 STRL LF DISP TIS (BLADE) IMPLANT
BLADE SURG 15 STRL SS (BLADE)
BRIEF STRETCH FOR OB PAD LRG (UNDERPADS AND DIAPERS) ×3 IMPLANT
CANISTER SUCT 3000ML PPV (MISCELLANEOUS) ×3 IMPLANT
CHLORAPREP W/TINT 26 (MISCELLANEOUS) IMPLANT
COVER BACK TABLE 60X90IN (DRAPES) ×3 IMPLANT
COVER MAYO STAND STRL (DRAPES) ×3 IMPLANT
COVER WAND RF STERILE (DRAPES) ×3 IMPLANT
DECANTER SPIKE VIAL GLASS SM (MISCELLANEOUS) ×3 IMPLANT
DERMABOND ADVANCED (GAUZE/BANDAGES/DRESSINGS) ×1
DERMABOND ADVANCED .7 DNX12 (GAUZE/BANDAGES/DRESSINGS) ×2 IMPLANT
DRAPE HYSTEROSCOPY (MISCELLANEOUS) IMPLANT
DRAPE LAPAROTOMY 100X72 PEDS (DRAPES) ×3 IMPLANT
DRAPE SHEET LG 3/4 BI-LAMINATE (DRAPES) IMPLANT
DRAPE UTILITY XL STRL (DRAPES) ×3 IMPLANT
DRSG PAD ABDOMINAL 8X10 ST (GAUZE/BANDAGES/DRESSINGS) ×3 IMPLANT
DRSG TEGADERM 4X4.75 (GAUZE/BANDAGES/DRESSINGS) IMPLANT
ELECT REM PT RETURN 9FT ADLT (ELECTROSURGICAL) ×3
ELECTRODE REM PT RTRN 9FT ADLT (ELECTROSURGICAL) ×2 IMPLANT
GAUZE SPONGE 4X4 12PLY STRL (GAUZE/BANDAGES/DRESSINGS) ×3 IMPLANT
GLOVE BIO SURGEON STRL SZ 6.5 (GLOVE) ×3 IMPLANT
GLOVE BIOGEL PI IND STRL 7.0 (GLOVE) ×2 IMPLANT
GLOVE BIOGEL PI INDICATOR 7.0 (GLOVE) ×1
GOWN STRL REUS W/TWL XL LVL3 (GOWN DISPOSABLE) ×3 IMPLANT
HYDROGEN PEROXIDE 16OZ (MISCELLANEOUS) ×3 IMPLANT
IV CATH 14GX2 1/4 (CATHETERS) ×3 IMPLANT
IV CATH 18G SAFETY (IV SOLUTION) ×3 IMPLANT
KIT SIGMOIDOSCOPE (SET/KITS/TRAYS/PACK) IMPLANT
KIT TURNOVER CYSTO (KITS) ×3 IMPLANT
LEGGING LITHOTOMY PAIR STRL (DRAPES) IMPLANT
LOOP VESSEL MAXI BLUE (MISCELLANEOUS) IMPLANT
NEEDLE HYPO 22GX1.5 SAFETY (NEEDLE) ×3 IMPLANT
NS IRRIG 500ML POUR BTL (IV SOLUTION) ×3 IMPLANT
PACK BASIN DAY SURGERY FS (CUSTOM PROCEDURE TRAY) ×3 IMPLANT
PAD ARMBOARD 7.5X6 YLW CONV (MISCELLANEOUS) IMPLANT
PENCIL SMOKE EVACUATOR (MISCELLANEOUS) ×3 IMPLANT
SPONGE HEMORRHOID 8X3CM (HEMOSTASIS) IMPLANT
SPONGE SURGIFOAM ABS GEL 12-7 (HEMOSTASIS) IMPLANT
STRIP CLOSURE SKIN 1/2X4 (GAUZE/BANDAGES/DRESSINGS) IMPLANT
SUCTION FRAZIER HANDLE 10FR (MISCELLANEOUS)
SUCTION TUBE FRAZIER 10FR DISP (MISCELLANEOUS) IMPLANT
SUT CHROMIC 2 0 SH (SUTURE) IMPLANT
SUT CHROMIC 3 0 SH 27 (SUTURE) IMPLANT
SUT ETHIBOND 0 (SUTURE) IMPLANT
SUT VIC AB 2-0 SH 27 (SUTURE)
SUT VIC AB 2-0 SH 27XBRD (SUTURE) IMPLANT
SUT VIC AB 3-0 SH 18 (SUTURE) IMPLANT
SUT VIC AB 3-0 SH 27 (SUTURE)
SUT VIC AB 3-0 SH 27XBRD (SUTURE) IMPLANT
SYR CONTROL 10ML LL (SYRINGE) ×3 IMPLANT
TOWEL OR 17X26 10 PK STRL BLUE (TOWEL DISPOSABLE) ×3 IMPLANT
TRAY DSU PREP LF (CUSTOM PROCEDURE TRAY) ×3 IMPLANT
TUBE CONNECTING 12X1/4 (SUCTIONS) ×3 IMPLANT
UNDERPAD 30X36 HEAVY ABSORB (UNDERPADS AND DIAPERS) IMPLANT
WATER STERILE IRR 1000ML POUR (IV SOLUTION) ×3 IMPLANT
YANKAUER SUCT BULB TIP NO VENT (SUCTIONS) ×3 IMPLANT

## 2020-09-01 NOTE — Op Note (Signed)
09/01/2020  7:53 AM  PATIENT:  Wesley Cruz  39 y.o. male  Patient Care Team: Tysinger, Camelia Eng, PA-C as PCP - General (Family Medicine)  PRE-OPERATIVE DIAGNOSIS:  anal fistula, skin tag  POST-OPERATIVE DIAGNOSIS:  anal fistula, skin tag  PROCEDURE:   FISTULOTOMY EXCISION OF SKIN TAG ANAL EXAM UNDER ANESTHESIA   Surgeon(s): Leighton Ruff, MD  ASSISTANT: none   ANESTHESIA:   local and MAC  SPECIMEN:  No Specimen  DISPOSITION OF SPECIMEN:  N/A  COUNTS:  YES  PLAN OF CARE: Discharge to home after PACU  PATIENT DISPOSITION:  PACU - hemodynamically stable.  INDICATION: 39 y.o. M with rectal drainage and anal fistula over previous fissure   OR FINDINGS: anal fistula at posterior midline, not involving the sphincter complex  DESCRIPTION: the patient was identified in the preoperative holding area and taken to the OR where they were laid on the operating room table.  MAC anesthesia was induced without difficulty. The patient was then positioned in prone jackknife position with buttocks gently taped apart.  The patient was then prepped and draped in usual sterile fashion.  SCDs were noted to be in place prior to the initiation of anesthesia. A surgical timeout was performed indicating the correct patient, procedure, positioning and need for preoperative antibiotics.  A rectal block was performed using Marcaine with epinephrine mixed with experel.    I began with a digital rectal exam.  There was an obvious posterior midline anal polyp.  There was a defect noted at posterior midline with a bridge of skin over it.  I then placed a Hill-Ferguson anoscope into the anal canal and evaluated this completely.  The anal canal was tight.  This was dilated using my fingers.  I placed a fistula probe into the anal fistula and confirmed that this was a shallow fistula.  I disconnected it using electrocautery from the patient's right side.  I was then able to place the Hill-Ferguson anoscope  into the anal canal.  I then used Metzenbaum scissors to resect the skin tag and the remaining edges of the fistula bridge on the left side.  There was no sphincter involvement noted in the fistula resection.  The incisions were then closed using running 3-0 chromic suture.  I inspected the anal canal for any other sign of fistula.  There were none noted.  The patient did have some grade 2 internal hemorrhoids.  I then removed the anoscope and a dressing was applied.  The patient was awakened from anesthesia and sent to the postanesthesia care unit in stable condition.  All counts were correct per operating room staff.

## 2020-09-01 NOTE — Transfer of Care (Signed)
Immediate Anesthesia Transfer of Care Note  Patient: Wesley Cruz  Procedure(s) Performed: FISTULOTOMY (N/A Rectum) EXCISION OF SKIN TAG (N/A Anus) anal EXAM UNDER ANESTHESIA (N/A Anus)  Patient Location: PACU  Anesthesia Type:MAC  Level of Consciousness: awake, alert , oriented and patient cooperative  Airway & Oxygen Therapy: Patient Spontanous Breathing  Post-op Assessment: Report given to RN and Post -op Vital signs reviewed and stable  Post vital signs: Reviewed and stable  Last Vitals:  Vitals Value Taken Time  BP 148/87 09/01/20 0803  Temp 36.6 C 09/01/20 0803  Pulse 78 09/01/20 0809  Resp 14 09/01/20 0809  SpO2 98 % 09/01/20 0809  Vitals shown include unvalidated device data.  Last Pain:  Vitals:   09/01/20 0803  TempSrc:   PainSc: 0-No pain      Patients Stated Pain Goal: 3 (97/84/78 4128)  Complications: No complications documented.

## 2020-09-01 NOTE — H&P (Signed)
  The patient is a 39 year old male who presents with a complaint of anal problems. 39 year old male recovering alcoholic who presents to the office for evaluation of fecal leakage. He states that this is present in some form every day. Approximately, 2 years ago, he developed a lot of rectal bleeding which prompted his sobriety and a colonoscopy. Over the last few months to year, he has noticed drainage after bowel movements. He also occasionally has rectal pain with bowel movements. He reports formed bowel movements approximately 75% of the time.   Past Surgical History Darden Palmer, [place]; 06/15/2020 2:59 PM) Vasectomy  Diagnostic Studies History Darden Palmer, [place]; 06/15/2020 2:59 PM) Colonoscopy 1-5 years ago  Allergies Darden Palmer, [place]; 06/15/2020 3:00 PM) No Known Drug Allergies [06/15/2020]: Allergies Reconciled  Medication History Darden Palmer, [place]; 06/15/2020 3:03 PM) Rosuvastatin Calcium (40MG  Tablet, Oral) Active. hydroCHLOROthiazide (25MG  Tablet, Oral) Active. Omega-3-acid Ethyl Esters (1GM Capsule, Oral) Active. Vitamin D (Oral) Specific strength unknown - Active. EPINEPHrine (0.3MG /0.3ML Soln Auto-inj, Injection) Active. Medications Reconciled  Social History Darden Palmer, [place]; 06/15/2020 2:59 PM) Alcohol use Remotely quit alcohol use. Caffeine use Carbonated beverages, Coffee. Illicit drug use Remotely quit drug use. Tobacco use Current every day smoker.  Family History Darden Palmer, [place]; 06/15/2020 2:59 PM) Alcohol Abuse Father. Colon Polyps Father. Hypertension Father.  Other Problems Darden Palmer, [place]; 06/15/2020 2:59 PM) Alcohol Abuse High blood pressure Hypercholesterolemia     Review of Systems  General Not Present- Appetite Loss, Chills, Fatigue, Fever, Night Sweats, Weight Gain and Weight Loss. Skin Present- Dryness. Not Present- Change in Wart/Mole, Hives, Jaundice, New Lesions, Non-Healing Wounds, Rash and  Ulcer. HEENT Present- Seasonal Allergies. Not Present- Earache, Hearing Loss, Hoarseness, Nose Bleed, Oral Ulcers, Ringing in the Ears, Sinus Pain, Sore Throat, Visual Disturbances, Wears glasses/contact lenses and Yellow Eyes. Respiratory Not Present- Bloody sputum, Chronic Cough, Difficulty Breathing, Snoring and Wheezing. Breast Not Present- Breast Mass, Breast Pain, Nipple Discharge and Skin Changes. Cardiovascular Not Present- Chest Pain, Difficulty Breathing Lying Down, Leg Cramps, Palpitations, Rapid Heart Rate, Shortness of Breath and Swelling of Extremities. Gastrointestinal Present- Rectal Pain. Not Present- Abdominal Pain, Bloating, Bloody Stool, Change in Bowel Habits, Chronic diarrhea, Constipation, Difficulty Swallowing, Excessive gas, Gets full quickly at meals, Hemorrhoids, Indigestion, Nausea and Vomiting. Male Genitourinary Not Present- Blood in Urine, Change in Urinary Stream, Frequency, Impotence, Nocturia, Painful Urination, Urgency and Urine Leakage. Musculoskeletal Not Present- Back Pain, Joint Pain, Joint Stiffness, Muscle Pain, Muscle Weakness and Swelling of Extremities. Neurological Not Present- Decreased Memory, Fainting, Headaches, Numbness, Seizures, Tingling, Tremor, Trouble walking and Weakness. Hematology Not Present- Blood Thinners, Easy Bruising, Excessive bleeding, Gland problems, HIV and Persistent Infections.  BP 133/77   Pulse 71   Temp 98.9 F (37.2 C) (Oral)   Resp 16   Ht 6\' 3"  (1.905 m)   Wt 114.9 kg   SpO2 99%   BMI 31.67 kg/m    Physical Exam   General Mental Status-Alert. General Appearance-Cooperative. CV: RRR Lungs: CTA Abd: soft Rectal Note: Left posterior, shallow anal fistula. Left lateral anal skin tag    Assessment & Plan   ANAL FISTULA (K60.3) Impression: 39 year old male with a very shallow anal fistula, which is causing fecal leakage. I have recommended exam under anesthesia with fistulotomy and skin tag  removal. We have discussed the typical postoperative pain and recovery times. We discussed the risk of recurrence. All questions were answered.

## 2020-09-01 NOTE — Anesthesia Postprocedure Evaluation (Signed)
Anesthesia Post Note  Patient: Wesley Cruz  Procedure(s) Performed: FISTULOTOMY (N/A Rectum) EXCISION OF SKIN TAG (N/A Anus) anal EXAM UNDER ANESTHESIA (N/A Anus)     Patient location during evaluation: PACU Anesthesia Type: MAC Level of consciousness: awake and alert Pain management: pain level controlled Vital Signs Assessment: post-procedure vital signs reviewed and stable Respiratory status: spontaneous breathing Cardiovascular status: stable Anesthetic complications: yes Comments: Small sublingual abrasion likely from oral airway or tongue depressor notified in PACU. I discussed with patient and had CRNA come and speak with patient as well. Recommend salt gargle and/or peroxide soaked q-tip to the area PRN.     No complications documented.  Last Vitals:  Vitals:   09/01/20 0830 09/01/20 0900  BP: 138/80 128/80  Pulse: 64 (!) 57  Resp: 13 20  Temp: 36.6 C 36.9 C  SpO2: 100% 98%    Last Pain:  Vitals:   09/01/20 0900  TempSrc:   PainSc: 0-No pain                 Nolon Nations

## 2020-09-01 NOTE — Discharge Instructions (Addendum)
Beginning the day after surgery:  You may sit in a tub of warm water 2-3 times a day to relieve discomfort.  Eat a regular diet high in fiber.  Avoid foods that give you constipation or diarrhea.  Avoid foods that are difficult to digest, such as seeds, nuts, corn or popcorn.  Do not go any longer than 2 days without a bowel movement.  You may take a dose of Milk of Magnesia if you become constipated.    Drink 6-8 glasses of water daily.  Walking is encouraged.  Avoid strenuous activity and heavy lifting for one month after surgery.    Call the office if you have any questions or concerns.  Call immediately if you develop:  Excessive rectal bleeding (more than a cup or passing large clots) Increased discomfort Fever greater than 100 F Difficulty urinating       Post Anesthesia Home Care Instructions  Activity: Get plenty of rest for the remainder of the day. A responsible individual must stay with you for 24 hours following the procedure.  For the next 24 hours, DO NOT: -Drive a car -Operate machinery -Drink alcoholic beverages -Take any medication unless instructed by your physician -Make any legal decisions or sign important papers.  Meals: Start with liquid foods such as gelatin or soup. Progress to regular foods as tolerated. Avoid greasy, spicy, heavy foods. If nausea and/or vomiting occur, drink only clear liquids until the nausea and/or vomiting subsides. Call your physician if vomiting continues.  Special Instructions/Symptoms: Your throat may feel dry or sore from the anesthesia or the breathing tube placed in your throat during surgery. If this causes discomfort, gargle with warm salt water. The discomfort should disappear within 24 hours.         Information for Discharge Teaching: EXPAREL (bupivacaine liposome injectable suspension)   Your surgeon or anesthesiologist gave you EXPAREL(bupivacaine) to help control your pain after surgery.  EXPAREL is a local  anesthetic that provides pain relief by numbing the tissue around the surgical site. EXPAREL is designed to release pain medication over time and can control pain for up to 72 hours. Depending on how you respond to EXPAREL, you may require less pain medication during your recovery.  Possible side effects: Temporary loss of sensation or ability to move in the area where bupivacaine was injected. Nausea, vomiting, constipation Rarely, numbness and tingling in your mouth or lips, lightheadedness, or anxiety may occur. Call your doctor right away if you think you may be experiencing any of these sensations, or if you have other questions regarding possible side effects.  Follow all other discharge instructions given to you by your surgeon or nurse. Eat a healthy diet and drink plenty of water or other fluids.  If you return to the hospital for any reason within 96 hours following the administration of EXPAREL, it is important for health care providers to know that you have received this anesthetic. A teal colored band has been placed on your arm with the date, time and amount of EXPAREL you have received in order to alert and inform your health care providers. Please leave this armband in place for the full 96 hours following administration, and then you may remove the band.  

## 2020-09-02 ENCOUNTER — Encounter (HOSPITAL_BASED_OUTPATIENT_CLINIC_OR_DEPARTMENT_OTHER): Payer: Self-pay | Admitting: General Surgery

## 2020-10-20 ENCOUNTER — Encounter: Payer: Self-pay | Admitting: Medical

## 2020-10-20 ENCOUNTER — Other Ambulatory Visit: Payer: Self-pay

## 2020-10-20 ENCOUNTER — Telehealth: Payer: BC Managed Care – PPO | Admitting: Medical

## 2020-10-20 VITALS — Temp 97.0°F | Wt 247.0 lb

## 2020-10-20 DIAGNOSIS — Z20822 Contact with and (suspected) exposure to covid-19: Secondary | ICD-10-CM

## 2020-10-20 DIAGNOSIS — R52 Pain, unspecified: Secondary | ICD-10-CM

## 2020-10-20 DIAGNOSIS — R059 Cough, unspecified: Secondary | ICD-10-CM

## 2020-10-20 NOTE — Progress Notes (Signed)
Subjective:     Patient ID: Wesley Cruz, male   DOB: [birth year], 39 y.o.   MRN: [id number]  This visit type was conducted due to national recommendations for restrictions regarding the COVID-19 Pandemic (e.g. social distancing) in an effort to limit this patient's exposure and mitigate transmission in our community.  Due to their co-morbid illnesses, this patient is at least at moderate risk for complications without adequate follow up.  This format is felt to be most appropriate for this patient at this time.    Documentation for virtual audio and video telecommunications through [facility] encounter:  The patient was located at home. The provider was located in the office. The patient did consent to this visit and is aware of possible charges through their insurance for this visit.  The other persons participating in this telemedicine service were none. Time spent on call was 20 minutes and in review of previous records 20 minutes total.  This virtual service is not related to other E/M service within previous 7 days.   HPI Chief Complaint  Patient presents with  . Covid Exposure    Exposed to covid- christmas day, symptoms started a week ago, ear popping, sinus pressure, cough, sore throat runny nose,    Virtual consult for exposure to Covid and respiratory symptoms.  He was exposed on Christmas day and.  Current symptoms include cough, sore throat, runny nose, ear pressure, ears popping, some riht chest discomfort.  No fever.  Has yellow mucous.   Cough is intermittent, not a lot.  Some voice changes, congestion.  No body aches, no chills, no NVD.   No sob or wheezing.   No hempotysis but is coughing thick mucous.  No recent covid test.  Worse symptom pressure behind right eye and ear popping.    Has had the covid vaccine.    Using sudafed the last 4 days, some daytime theraflu.   No other aggravating or relieving factors. No other complaint.   Past Medical History:  Diagnosis  Date  . Anal skin tag   . Elevated LFTs    2017 - 2021 ; abnormal [place], negative hepatitis infection 2017 labs  . History of alcohol abuse    16 years of drinking; went through residential treatment program 2018  . Hyperlipidemia 6/15  . Hypertension 6/15  . Seizure ([facility]) 2009   questionable seizure, syncope, one episode, none since - [facility] at [facility] ED     Review of Systems As in subjective    Objective:   Physical Exam Due to coronavirus pandemic stay at home measures, patient visit was virtual and they were not examined in person.   Temp (!) 97 F (36.1 C)   Wt 247 lb (112 kg)   BMI 30.87 kg/m   Gen: wd, wn, nad No obvious wheezing or dyspnea Answers questions in complete sentences     Assessment:     Encounter Diagnoses  Name Primary?  . Exposure to COVID-19 virus Yes  . Cough   . Body aches        Plan:     Discussed symptoms, concerns and he is on about day 7 of symptoms and doesn't seem severe.    Advised he continue hydration, rest, can use OTC tylenol or ibuprofen for aches, fever, headaches, etc, can use OTC cough/congestion medication but advised caution on sudafed given underlying HTN.  Will c/t supportive therapy over the weekend as he seems to be improving.   If worse or changes in symptoms  in the coming days, recheck.   otherwise assuming no fever, no significant cough or sneezing on Sunday, can return to work Monday which would be 11 days since beginning of symptoms and exposure to covid.    Wesley Cruz was seen today for covid exposure.  Diagnoses and all orders for this visit:  Exposure to COVID-19 virus  Cough  Body aches  f/u prn

## 2020-10-21 DIAGNOSIS — U071 COVID-19: Secondary | ICD-10-CM | POA: Diagnosis not present

## 2020-11-08 ENCOUNTER — Telehealth: Payer: Self-pay | Admitting: Medical

## 2020-11-08 DIAGNOSIS — L84 Corns and callosities: Secondary | ICD-10-CM

## 2020-11-08 DIAGNOSIS — B351 Tinea unguium: Secondary | ICD-10-CM

## 2020-11-08 NOTE — Telephone Encounter (Signed)
Pt called and wanted a referral to a podiatrist. He is having issues with his left foot still

## 2020-11-09 NOTE — Telephone Encounter (Signed)
Referral has been sent.

## 2020-11-09 NOTE — Telephone Encounter (Signed)
That is fine, refer

## 2020-11-22 ENCOUNTER — Other Ambulatory Visit: Payer: Self-pay

## 2020-11-22 ENCOUNTER — Encounter: Payer: Self-pay | Admitting: Podiatry

## 2020-11-22 ENCOUNTER — Ambulatory Visit: Payer: BC Managed Care – PPO | Admitting: Podiatry

## 2020-11-22 DIAGNOSIS — D2372 Other benign neoplasm of skin of left lower limb, including hip: Secondary | ICD-10-CM | POA: Diagnosis not present

## 2020-11-22 DIAGNOSIS — R52 Pain, unspecified: Secondary | ICD-10-CM

## 2020-11-22 DIAGNOSIS — M7742 Metatarsalgia, left foot: Secondary | ICD-10-CM

## 2020-11-22 NOTE — Progress Notes (Signed)
  Subjective:  Patient ID: Wesley Cruz, male    DOB: [birth year],  MRN: [id number]  Chief Complaint  Patient presents with  . Callouses    Left foot underneath the toes PT has a possible corn that has been there for about a year. He stated that he like to walk and plays a lot of golf and the pain is starting to irritate him     40 y.o. male presents with the above complaint. History confirmed with patient.   Objective:  Physical Exam: warm, good capillary refill, no trophic changes or ulcerative lesions, normal DP and PT pulses and normal sensory exam. Left Foot: Diffuse hyperkeratosis across the metatarsals, there is a punctate hyperkeratotic painful growth submet four, very painful to palpation  Assessment:   1. Benign neoplasm of skin of left foot   2. Pain   3. Metatarsalgia of left foot      Plan:  Patient was evaluated and treated and all questions answered.  Discussed with him the etiology and treatment of these types of lesions.  Recommended debridement and destruction of the lesion today.  I debrided with a chisel blade as deep as a code and then applied Cantharone Plus.  Post care instructions given.  Advised for him to use pumice stone and urea cream daily.  In the future he may need metatarsal support with an orthotic that has a metatarsal pad built in considering, she is on his feet and likes to golf.  Return in about 4 weeks (around 12/20/2020).

## 2020-11-22 NOTE — Patient Instructions (Signed)
Look for urea 40% cream or ointment and apply to the thickened dry skin / calluses. This can be bought over the counter, at a pharmacy or online such as [employer].    Take dressing off in 8 hours and wash the foot with soap and water. If it is hurting or becomes uncomfortable before the 8 hours, go ahead and remove the bandage and wash the area.  If it blisters, apply antibiotic ointment and a band-aid.   Monitor for any signs/symptoms of infection. Call the office immediately if any occur or go directly to the emergency room. Call with any questions/concerns.

## 2020-11-27 ENCOUNTER — Other Ambulatory Visit: Payer: Self-pay | Admitting: Medical

## 2020-12-22 ENCOUNTER — Ambulatory Visit: Payer: BC Managed Care – PPO | Admitting: Podiatry

## 2020-12-27 ENCOUNTER — Ambulatory Visit: Payer: BC Managed Care – PPO | Admitting: Podiatry

## 2021-01-02 DIAGNOSIS — L821 Other seborrheic keratosis: Secondary | ICD-10-CM | POA: Diagnosis not present

## 2021-01-02 DIAGNOSIS — L819 Disorder of pigmentation, unspecified: Secondary | ICD-10-CM | POA: Diagnosis not present

## 2021-01-02 DIAGNOSIS — L814 Other melanin hyperpigmentation: Secondary | ICD-10-CM | POA: Diagnosis not present

## 2021-01-02 DIAGNOSIS — D1801 Hemangioma of skin and subcutaneous tissue: Secondary | ICD-10-CM | POA: Diagnosis not present

## 2021-01-02 DIAGNOSIS — L570 Actinic keratosis: Secondary | ICD-10-CM | POA: Diagnosis not present

## 2021-01-03 ENCOUNTER — Other Ambulatory Visit: Payer: Self-pay

## 2021-01-03 ENCOUNTER — Ambulatory Visit: Payer: BC Managed Care – PPO | Admitting: Podiatry

## 2021-01-03 DIAGNOSIS — L600 Ingrowing nail: Secondary | ICD-10-CM

## 2021-01-03 DIAGNOSIS — R52 Pain, unspecified: Secondary | ICD-10-CM | POA: Diagnosis not present

## 2021-01-03 DIAGNOSIS — M7742 Metatarsalgia, left foot: Secondary | ICD-10-CM

## 2021-01-03 DIAGNOSIS — D2372 Other benign neoplasm of skin of left lower limb, including hip: Secondary | ICD-10-CM | POA: Diagnosis not present

## 2021-01-04 ENCOUNTER — Encounter: Payer: Self-pay | Admitting: Podiatry

## 2021-01-04 NOTE — Progress Notes (Signed)
  Subjective:  Patient ID: Wesley Cruz, male    DOB: [birth year],  MRN: [id number]  Chief Complaint  Patient presents with  . Callouses    Left foot callus Pt stated that it feels much better    40 y.o. male returns for follow-up with the above complaint. History confirmed with patient.  Cantharone treatment was successful and developed a blister and alleviate most of his pain.  There is still some residual callus there that he would like from down.  He has a golf trip coming up and does not want to apply the Cantharone again today.  He also inquires about ingrown toenail removal on the right hallux  Objective:  Physical Exam: warm, good capillary refill, no trophic changes or ulcerative lesions, normal DP and PT pulses and normal sensory exam. Left Foot: Punctate submetatarsal 4 painful keratotic lesion, improved since last visit, central core remains post debridement of the overlying hyperkeratosis hallux ingrown medial nail border without paronychia.   Assessment:   No diagnosis found.   Plan:  Patient was evaluated and treated and all questions answered.  Doing quite well today, he did not require further Cantharone treatment just yet.  We will plan to do this after he returns from his trip which will hopefully finally alleviate the lesion.  I debrided the overlying hyperkeratosis which was helpful for him  He also inquired about ingrown toenails in the left hallux, he often gets pedicures for this to alleviate the corner but he would prefer not to have to do this in the future.  Previously had permanent nail root removal on the right hallux when he was a kid.  Would only like the corner removed permanently.  We will plan to do this after he returns from his trip as well  Return in about 1 month (around 02/03/2021) for lesion on left foot treatment, ingrown toenail permanent partial removal.

## 2021-01-24 ENCOUNTER — Other Ambulatory Visit: Payer: Self-pay

## 2021-01-24 ENCOUNTER — Encounter: Payer: Self-pay | Admitting: Medical

## 2021-01-24 ENCOUNTER — Ambulatory Visit: Payer: BC Managed Care – PPO | Admitting: Medical

## 2021-01-24 VITALS — BP 122/76 | HR 72 | Ht 76.0 in | Wt 244.6 lb

## 2021-01-24 DIAGNOSIS — E785 Hyperlipidemia, unspecified: Secondary | ICD-10-CM | POA: Diagnosis not present

## 2021-01-24 DIAGNOSIS — E559 Vitamin D deficiency, unspecified: Secondary | ICD-10-CM | POA: Diagnosis not present

## 2021-01-24 DIAGNOSIS — R7989 Other specified abnormal findings of blood chemistry: Secondary | ICD-10-CM

## 2021-01-24 DIAGNOSIS — I10 Essential (primary) hypertension: Secondary | ICD-10-CM

## 2021-01-24 MED ORDER — HYDROCHLOROTHIAZIDE 25 MG PO TABS: 25.0000 mg | ORAL_TABLET | Freq: Every day | ORAL | 1 refills | Status: DC

## 2021-01-24 NOTE — Progress Notes (Signed)
Subjective: Chief Complaint  Patient presents with  . Follow-up    6 month follow up.    Here for routine f/u on medication.  Since last visit saw general surgery, had repair for rectal fistula and this seemed to fix the problems.  Was happy with the outcome.  Still working on weight loss efforts.  Has had some success.  Still sober, drug and alcohol years x 4 years as of May 4.  Compliant with medications listed.  Doing fine on cholesterol medication.    No new c/o  Past Medical History:  Diagnosis Date  . Anal skin tag   . Elevated LFTs    2017 - 2021 ; abnormal [place], negative hepatitis infection 2017 labs  . History of alcohol abuse    16 years of drinking; went through residential treatment program 2018  . Hyperlipidemia 6/15  . Hypertension 6/15  . Seizure ([place]) 2009   questionable seizure, syncope, one episode, none since - seen at [facility] ED    Current Outpatient Medications on File Prior to Visit  Medication Sig Dispense Refill  . cholecalciferol (VITAMIN D) 1000 units tablet Take 1 tablet (1,000 Units total) by mouth daily. (Patient taking differently: Take 5,000 Units by mouth daily.) 30 tablet 11  . EPINEPHrine (EPIPEN 2-PAK) 0.3 mg/0.3 mL IJ SOAJ injection Inject 0.3 mLs (0.3 mg total) into the muscle once as needed (for severe allergic reaction). CAll 911 immediately if you have to use this medicine 1 each 1  . omega-3 acid ethyl esters (LOVAZA) 1 g capsule TAKE 2 CAPSULES BY MOUTH TWICE DAILY 120 capsule 10  . rosuvastatin (CRESTOR) 40 MG tablet TAKE 1 TABLET(40 MG) BY MOUTH DAILY 90 tablet 0   No current facility-administered medications on file prior to visit.   ROS as in subjective    Objective BP 122/76   Pulse 72   Ht 6\' 4"  (1.93 m)   Wt 244 lb 9.6 oz (110.9 kg)   SpO2 94%   BMI 29.77 kg/m   Wt Readings from Last 3 Encounters:  01/24/21 244 lb 9.6 oz (110.9 kg)  10/20/20 247 lb (112 kg)  09/01/20 253 lb 6.4 oz (114.9 kg)   Gen: wd, wn, nad Heart  rrr, normal s1, s2, no murmurs Ext: no edema Pulses 2+    Assessment: Encounter Diagnoses  Name Primary?  . Elevated LFTs Yes  . Essential hypertension   . Vitamin D deficiency   . Hyperlipidemia, unspecified hyperlipidemia type      Plan: Congratulated him on his weight loss efforts.  Continue regular exercise and healthy diet Glad he is still maintaining his sobriety Continue current medications Labs as below Follow-up in the summer for fasting physical as planned  Wesley Cruz was seen today for follow-up.  Diagnoses and all orders for this visit:  Elevated LFTs -     ALT -     CK  Essential hypertension  Vitamin D deficiency  Hyperlipidemia, unspecified hyperlipidemia type -     ALT -     CK  Other orders -     hydrochlorothiazide (HYDRODIURIL) 25 MG tablet; Take 1 tablet (25 mg total) by mouth daily.   F/u pending labs, physical in summer

## 2021-01-25 ENCOUNTER — Other Ambulatory Visit: Payer: Self-pay | Admitting: Medical

## 2021-01-25 LAB — CK: Total CK: 264 U/L (ref 49–439)

## 2021-01-25 LAB — ALT: ALT: 30 IU/L (ref 0–44)

## 2021-02-07 ENCOUNTER — Ambulatory Visit: Payer: BC Managed Care – PPO | Admitting: Podiatry

## 2021-02-15 ENCOUNTER — Telehealth: Payer: BC Managed Care – PPO | Admitting: Medical

## 2021-02-15 ENCOUNTER — Other Ambulatory Visit: Payer: Self-pay

## 2021-02-16 ENCOUNTER — Telehealth: Payer: BC Managed Care – PPO | Admitting: Medical

## 2021-02-17 NOTE — Progress Notes (Signed)
error 

## 2021-02-21 ENCOUNTER — Ambulatory Visit: Payer: BC Managed Care – PPO | Admitting: Podiatry

## 2021-02-21 ENCOUNTER — Other Ambulatory Visit: Payer: Self-pay

## 2021-02-21 ENCOUNTER — Encounter: Payer: Self-pay | Admitting: Podiatry

## 2021-02-21 DIAGNOSIS — R52 Pain, unspecified: Secondary | ICD-10-CM | POA: Diagnosis not present

## 2021-02-21 DIAGNOSIS — L600 Ingrowing nail: Secondary | ICD-10-CM

## 2021-02-21 DIAGNOSIS — L84 Corns and callosities: Secondary | ICD-10-CM | POA: Diagnosis not present

## 2021-02-21 MED ORDER — NEOMYCIN-POLYMYXIN-HC 3.5-10000-1 OT SUSP: OTIC | 0 refills | Status: DC

## 2021-02-21 NOTE — Patient Instructions (Signed)

## 2021-02-21 NOTE — Progress Notes (Signed)
  Subjective:  Patient ID: Wesley Cruz, male    DOB: 02/14/1981,  MRN: [id number]  Chief Complaint  Patient presents with  . Callouses     lesion on left foot treatment,   . Ingrown Toenail    ingrown toenail permanent partial removal     40 y.o. male returns for follow-up with the above complaint. History confirmed with patient.  Calluses started to bother him somewhat again.  Would like to have the ingrown toenail removed today  Objective:  Physical Exam: warm, good capillary refill, no trophic changes or ulcerative lesions, normal DP and PT pulses and normal sensory exam. Left Foot: Punctate submetatarsal 4 painful keratotic lesion, improved since last visit, central core remains post debridement of the overlying hyperkeratosis hallux ingrown medial nail border without paronychia.   Assessment:   1. Ingrowing right great toenail   2. Callus of foot      Plan:  Patient was evaluated and treated and all questions answered.  All symptomatic hyperkeratoses were safely debrided with a sterile #15 blade to patient's level of comfort without incident. We discussed preventative and palliative care of these lesions including supportive and accommodative shoegear, padding, prefabricated and custom molded accommodative orthoses, use of a pumice stone and lotions/creams daily.    Ingrown Nail, left -Patient elects to proceed with minor surgery to remove ingrown toenail today. Consent reviewed and signed by patient. -Ingrown nail excised. See procedure note. -Educated on post-procedure care including soaking. Written instructions provided and reviewed. -Patient to follow up in 2 weeks for nail check.  Procedure: Excision of Ingrown Toenail Location: Left 1st toe medial nail borders. Anesthesia: Lidocaine 1% plain; 1.5 mL and Marcaine 0.5% plain; 1.5 mL, digital block. Skin Prep: Betadine. Dressing: Silvadene; telfa; dry, sterile, compression dressing. Technique: Following skin  prep, the toe was exsanguinated and a tourniquet was secured at the base of the toe. The affected nail border was freed, split with a nail splitter, and excised. Chemical matrixectomy was then performed with phenol and irrigated out with alcohol. The tourniquet was then removed and sterile dressing applied. Disposition: Patient tolerated procedure well. Patient to return in 2 weeks for follow-up.     Return in about 2 weeks (around 03/07/2021) for nail re-check.

## 2021-03-07 ENCOUNTER — Encounter: Payer: Self-pay | Admitting: Podiatry

## 2021-03-07 ENCOUNTER — Ambulatory Visit: Payer: BC Managed Care – PPO | Admitting: Podiatry

## 2021-03-07 ENCOUNTER — Other Ambulatory Visit: Payer: Self-pay

## 2021-03-07 DIAGNOSIS — D2372 Other benign neoplasm of skin of left lower limb, including hip: Secondary | ICD-10-CM | POA: Diagnosis not present

## 2021-03-07 DIAGNOSIS — L600 Ingrowing nail: Secondary | ICD-10-CM

## 2021-03-07 NOTE — Progress Notes (Signed)
  Subjective:  Patient ID: Wesley Cruz, male    DOB: 06-25-1981,  MRN: [id number]  Chief Complaint  Patient presents with  . Ingrown Toenail      2w nail check right    40 y.o. male returns for follow-up with the above complaint. History confirmed with patient.  Both callus and the ingrown nail are doing quite well  Objective:  Physical Exam: warm, good capillary refill, no trophic changes or ulcerative lesions, normal DP and PT pulses and normal sensory exam. Left Foot: Matricectomy site is healing well, the callus has loose dead skin and appears to have blistered off the porokeratosis   Assessment:   1. Ingrowing right great toenail   2. Benign neoplasm of skin of left foot      Plan:  Patient was evaluated and treated and all questions answered.  Remove the overlying dead skin.  Return as needed if this becomes painful again    Ingrown Nail, left -Doing well can leave open to air advised may continue to swell and be red for another week or 2, return as needed if this bothersome     No follow-ups on file.

## 2021-05-16 ENCOUNTER — Other Ambulatory Visit: Payer: Self-pay | Admitting: Medical

## 2021-05-29 ENCOUNTER — Other Ambulatory Visit: Payer: Self-pay | Admitting: Medical

## 2021-05-29 NOTE — Telephone Encounter (Signed)
Should have enough until October. I will deny

## 2021-05-30 ENCOUNTER — Encounter: Payer: Self-pay | Admitting: Medical

## 2021-05-30 ENCOUNTER — Ambulatory Visit (INDEPENDENT_AMBULATORY_CARE_PROVIDER_SITE_OTHER): Payer: BC Managed Care – PPO | Admitting: Medical

## 2021-05-30 ENCOUNTER — Other Ambulatory Visit: Payer: Self-pay

## 2021-05-30 VITALS — BP 120/80 | HR 67 | Ht 75.0 in | Wt 246.0 lb

## 2021-05-30 DIAGNOSIS — E559 Vitamin D deficiency, unspecified: Secondary | ICD-10-CM | POA: Diagnosis not present

## 2021-05-30 DIAGNOSIS — F1011 Alcohol abuse, in remission: Secondary | ICD-10-CM

## 2021-05-30 DIAGNOSIS — I10 Essential (primary) hypertension: Secondary | ICD-10-CM | POA: Diagnosis not present

## 2021-05-30 DIAGNOSIS — Z0000 Encounter for general adult medical examination without abnormal findings: Secondary | ICD-10-CM

## 2021-05-30 DIAGNOSIS — E785 Hyperlipidemia, unspecified: Secondary | ICD-10-CM

## 2021-05-30 DIAGNOSIS — L409 Psoriasis, unspecified: Secondary | ICD-10-CM | POA: Diagnosis not present

## 2021-05-30 DIAGNOSIS — Z6830 Body mass index (BMI) 30.0-30.9, adult: Secondary | ICD-10-CM

## 2021-05-30 DIAGNOSIS — Z889 Allergy status to unspecified drugs, medicaments and biological substances status: Secondary | ICD-10-CM

## 2021-05-30 DIAGNOSIS — Z79899 Other long term (current) drug therapy: Secondary | ICD-10-CM

## 2021-05-30 DIAGNOSIS — Z7185 Encounter for immunization safety counseling: Secondary | ICD-10-CM

## 2021-05-30 DIAGNOSIS — K760 Fatty (change of) liver, not elsewhere classified: Secondary | ICD-10-CM | POA: Diagnosis not present

## 2021-05-30 DIAGNOSIS — Z823 Family history of stroke: Secondary | ICD-10-CM

## 2021-05-30 DIAGNOSIS — F17200 Nicotine dependence, unspecified, uncomplicated: Secondary | ICD-10-CM

## 2021-05-30 DIAGNOSIS — F988 Other specified behavioral and emotional disorders with onset usually occurring in childhood and adolescence: Secondary | ICD-10-CM

## 2021-05-30 NOTE — Progress Notes (Signed)
Subjective:   HPI  Wesley Cruz is a 40 y.o. male who presents for Chief Complaint  Patient presents with   cpe    Fasting cpe, no concerns    Patient Care Team: Portia Wisdom, Leward Quan as PCP - General (Family Medicine) Sees dentist Sees eye doctor Dr. Lanae Crumbly, podiatry Dr. Leighton Ruff, general surgery Dr. Wilfrid Lund, GI   Concerns: None, doing well.  Reviewed their medical, surgical, family, social, medication, and allergy history and updated chart as appropriate.  Past Medical History:  Diagnosis Date   Anal skin tag    Elevated LFTs    2017 - 2021 ; abnormal [place], negative hepatitis infection 2017 labs   History of alcohol abuse    16 years of drinking; went through residential treatment program 2018   Hyperlipidemia 6/15   Hypertension 6/15   Seizure ([place]) 2009   questionable seizure, syncope, one episode, none since - seen at [facility] ED     Past Surgical History:  Procedure Laterality Date   COLONOSCOPY  2017   EXCISION OF SKIN TAG N/A 09/01/2020   Procedure: EXCISION OF SKIN TAG;  Surgeon: Leighton Ruff, MD;  Location: [facility];  Service: General;  Laterality: N/A;   FISTULOTOMY N/A 09/01/2020   Procedure: FISTULOTOMY;  Surgeon: Leighton Ruff, MD;  Location: [facility];  Service: General;  Laterality: N/A;   LACERATION REPAIR  2009   right dorsal hand; s/p punching wall   RECTAL EXAM UNDER ANESTHESIA N/A 09/01/2020   Procedure: anal EXAM UNDER ANESTHESIA;  Surgeon: Leighton Ruff, MD;  Location: [facility];  Service: General;  Laterality: N/A;    Family History  Problem Relation Age of Onset   Hypertension Father    Hypertension Paternal Uncle    Stroke Paternal Uncle 66   Stroke Paternal Uncle 64   Heart disease Paternal Uncle        CABG   Heart disease Paternal Uncle        CAD, stents   Cancer Paternal Grandmother        lung?   COPD Paternal Grandmother    COPD Paternal Grandfather     Diabetes Neg Hx      Current Outpatient Medications:    cholecalciferol (VITAMIN D) 1000 units tablet, Take 1 tablet (1,000 Units total) by mouth daily. (Patient taking differently: Take 2,000 Units by mouth daily.), Disp: 30 tablet, Rfl: 11   EPINEPHrine (EPIPEN 2-PAK) 0.3 mg/0.3 mL IJ SOAJ injection, Inject 0.3 mLs (0.3 mg total) into the muscle once as needed (for severe allergic reaction). CAll 911 immediately if you have to use this medicine, Disp: 1 each, Rfl: 1   hydrochlorothiazide (HYDRODIURIL) 25 MG tablet, Take 1 tablet (25 mg total) by mouth daily., Disp: 90 tablet, Rfl: 1   omega-3 acid ethyl esters (LOVAZA) 1 g capsule, TAKE 2 CAPSULES BY MOUTH TWICE DAILY, Disp: 120 capsule, Rfl: 10   rosuvastatin (CRESTOR) 40 MG tablet, TAKE 1 TABLET(40 MG) BY MOUTH DAILY, Disp: 90 tablet, Rfl: 0   neomycin-polymyxin-hydrocortisone (CORTISPORIN) 3.5-10000-1 OTIC suspension, Apply 1-2 drops daily after soaking and cover with bandaid, Disp: 10 mL, Rfl: 0  Allergies  Allergen Reactions   Hornet Venom Anaphylaxis    Japanese hornet   Lisinopril     cough     Review of Systems Constitutional: -fever, -chills, -sweats, -unexpected weight change, -decreased appetite, -fatigue Allergy: -sneezing, -itching, -congestion Dermatology: -changing moles, --rash, -lumps ENT: -runny nose, -ear pain, -sore throat, -  hoarseness, -sinus pain, -teeth pain, - ringing in ears, -hearing loss, -nosebleeds Cardiology: -chest pain, -palpitations, -swelling, -difficulty breathing when lying flat, -waking up short of breath Respiratory: -cough, -shortness of breath, -difficulty breathing with exercise or exertion, -wheezing, -coughing up blood Gastroenterology: -abdominal pain, -nausea, -vomiting, -diarrhea, -constipation, -blood in stool, -changes in bowel movement, -difficulty swallowing or eating Hematology: -bleeding, -bruising  Musculoskeletal: -joint aches, -muscle aches, -joint swelling, -back pain, -neck  pain, -cramping, -changes in gait Ophthalmology: denies vision changes, eye redness, itching, discharge Urology: -burning with urination, -difficulty urinating, -blood in urine, -urinary frequency, -urgency, -incontinence Neurology: -headache, -weakness, -tingling, -numbness, -memory loss, -falls, -dizziness Psychology: -depressed mood, -agitation, -sleep problems Male GU: no testicular mass, pain, no lymph nodes swollen, no swelling, no rash.  Depression screen [facility] 2/9 05/30/2021 05/09/2020 03/24/2019 03/18/2018 07/23/2017  Decreased Interest 0 0 0 0 0  Down, Depressed, Hopeless 0 0 0 0 0  PHQ - 2 Score 0 0 0 0 0  Altered sleeping - - - 0 -  Tired, decreased energy - - - 0 -  Change in appetite - - - 0 -  Feeling bad or failure about yourself  - - - 0 -  Trouble concentrating - - - 0 -  Moving slowly or fidgety/restless - - - 0 -  Suicidal thoughts - - - 0 -  PHQ-9 Score - - - 0 -  Difficult doing work/chores - - - Not difficult at all -        Objective:  BP 120/80   Pulse 67   Ht '6\' 3"'$  (1.905 m)   Wt 246 lb (111.6 kg)   BMI 30.75 kg/m   General appearance: alert, no distress, WD/WN, Caucasian male Skin: unremarkable HEENT: normocephalic, conjunctiva/corneas normal, sclerae anicteric, PERRLA, EOMi, nares patent, no discharge or erythema, pharynx normal Neck: supple, no lymphadenopathy, no thyromegaly, no masses, normal ROM, no bruits Chest: non tender, normal shape and expansion Heart: RRR, normal S1, S2, no murmurs Lungs: CTA bilaterally, no wheezes, rhonchi, or rales Abdomen: +bs, soft, non tender, non distended, no masses, no hepatomegaly, no splenomegaly, no bruits Back: non tender, normal ROM, no scoliosis Musculoskeletal: upper extremities non tender, no obvious deformity, normal ROM throughout, lower extremities non tender, no obvious deformity, normal ROM throughout Extremities: no edema, no cyanosis, no clubbing Pulses: 2+ symmetric, upper and lower extremities, normal  cap refill Neurological: alert, oriented x 3, CN2-12 intact, strength normal upper extremities and lower extremities, sensation normal throughout, DTRs 2+ throughout, no cerebellar signs, gait normal Psychiatric: normal affect, behavior normal, pleasant  GU: normal male external genitalia,circumcised, nontender, no masses, no hernia, no lymphadenopathy Rectal: deferred   Assessment and Plan :   Encounter Diagnoses  Name Primary?   Encounter for health maintenance examination in adult Yes   Essential hypertension    Fatty liver    Psoriasis    Attention deficit disorder, unspecified hyperactivity presence    BMI 30.0-30.9,adult    Vitamin D deficiency    Vaccine counseling    Smoker    Hyperlipidemia, unspecified hyperlipidemia type    History of allergic reaction    History of alcohol abuse    High risk medication use    Family history of stroke     This visit was a preventative care visit, also known as wellness visit or routine physical.   Topics typically include healthy lifestyle, diet, exercise, preventative care, vaccinations, sick and well care, proper use of emergency dept and after hours care, as  well as other concerns.     Recommendations: Continue to return yearly for your annual wellness and preventative care visits.  This gives [place] a chance to discuss healthy lifestyle, exercise, vaccinations, review your chart record, and perform screenings where appropriate.  I recommend you see your eye doctor yearly for routine vision care.  I recommend you see your dentist yearly for routine dental care including hygiene visits twice yearly.   Vaccination recommendations were reviewed Immunization History  Administered Date(s) Administered   Influenza-Unspecified 08/22/2018   PFIZER(Purple Top)SARS-COV-2 Vaccination 12/26/2019, 01/26/2020   Pneumococcal Polysaccharide-23 03/24/2019   Tdap 12/20/2014    Advised yearly flu shot in the fall   Screening for  cancer: Colon cancer screening: Age 72  Testicular cancer screening You should do a monthly self testicular exam if you are between 92-66 years old  We discussed PSA, prostate exam, and prostate cancer screening risks/benefits.   Age 42  Skin cancer screening: Check your skin regularly for new changes, growing lesions, or other lesions of concern Come in for evaluation if you have skin lesions of concern.  Lung cancer screening: If you have a greater than 20 pack year history of tobacco use, then you may qualify for lung cancer screening with a chest CT scan.   Please call your insurance company to inquire about coverage for this test.  We currently don't have screenings for other cancers besides breast, cervical, colon, and lung cancers.  If you have a strong family history of cancer or have other cancer screening concerns, please let me know.    Bone health: Get at least 150 minutes of aerobic exercise weekly Get weight bearing exercise at least once weekly Bone density test:  A bone density test is an imaging test that uses a type of X-ray to measure the amount of calcium and other minerals in your bones. The test may be used to diagnose or screen you for a condition that causes weak or thin bones (osteoporosis), predict your risk for a broken bone (fracture), or determine how well your osteoporosis treatment is working. The bone density test is recommended for females 29 and older, or females or males [id number] if certain risk factors such as thyroid disease, long term use of steroids such as for asthma or rheumatological issues, vitamin D deficiency, estrogen deficiency, family history of osteoporosis, self or family history of fragility fracture in first degree relative.    Heart health: Get at least 150 minutes of aerobic exercise weekly Limit alcohol It is important to maintain a healthy blood pressure and healthy cholesterol numbers  Heart disease screening: Screening for heart  disease includes screening for blood pressure, fasting lipids, glucose/diabetes screening, BMI height to weight ratio, reviewed of smoking status, physical activity, and diet.    Goals include blood pressure 120/80 or less, maintaining a healthy lipid/cholesterol profile, preventing diabetes or keeping diabetes numbers under good control, not smoking or using tobacco products, exercising most days per week or at least 150 minutes per week of exercise, and eating healthy variety of fruits and vegetables, healthy oils, and avoiding unhealthy food choices like fried food, fast food, high sugar and high cholesterol foods.    Other tests may possibly include EKG test, CT coronary calcium score, echocardiogram, exercise treadmill stress test.   We discussed doing CT coronary calcium score.  He will consider and let me know    Medical care options: I recommend you continue to seek care here first for routine care.  We try  really hard to have available appointments Monday through Friday daytime hours for sick visits, acute visits, and physicals.  Urgent care should be used for after hours and weekends for significant issues that cannot wait till the next day.  The emergency department should be used for significant potentially life-threatening emergencies.  The emergency department is expensive, can often have long wait times for less significant concerns, so try to utilize primary care, urgent care, or telemedicine when possible to avoid unnecessary trips to the emergency department.  Virtual visits and telemedicine have been introduced since the pandemic started in 2020, and can be convenient ways to receive medical care.  We offer virtual appointments as well to assist you in a variety of options to seek medical care.   Separate significant issues discussed: HTN -continue current medication, routine labs today  Hyperlipidemia and fatty liver disease-continue current medications Crestor and  Lovaza  Vitamin D deficiency-he is currently using vitamin D 2000 units daily.  Labs today  Tobacco use - advised cessation.  Baseline PFT today.  Our PFT instrument didn't work quite right today, but suggests decreased function.  Glad to hear he is still sober, he continues with AA meetings  Psoriasis - no recent concerns  Obesity - work on efforts to lose weight through health diet and exercise    Adarryl was seen today for cpe.  Diagnoses and all orders for this visit:  Encounter for health maintenance examination in adult -     Comprehensive metabolic panel -     CBC with Differential/Platelet -     Lipid panel -     Urinalysis -     VITAMIN D 25 Hydroxy (Vit-D Deficiency, Fractures)  Essential hypertension -     Comprehensive metabolic panel  Fatty liver -     Lipid panel  Psoriasis  Attention deficit disorder, unspecified hyperactivity presence  BMI 30.0-30.9,adult  Vitamin D deficiency -     VITAMIN D 25 Hydroxy (Vit-D Deficiency, Fractures)  Vaccine counseling  Smoker  Hyperlipidemia, unspecified hyperlipidemia type -     Lipid panel  History of allergic reaction  History of alcohol abuse  High risk medication use  Family history of stroke   Follow-up pending labs, yearly for physical

## 2021-05-30 NOTE — Patient Instructions (Signed)
This visit was a preventative care visit, also known as wellness visit or routine physical.   Topics typically include healthy lifestyle, diet, exercise, preventative care, vaccinations, sick and well care, proper use of emergency dept and after hours care, as well as other concerns.     Recommendations: Continue to return yearly for your annual wellness and preventative care visits.  This gives [place] a chance to discuss healthy lifestyle, exercise, vaccinations, review your chart record, and perform screenings where appropriate.  I recommend you see your eye doctor yearly for routine vision care.  I recommend you see your dentist yearly for routine dental care including hygiene visits twice yearly.   Vaccination recommendations were reviewed Immunization History  Administered Date(s) Administered   Influenza-Unspecified 08/22/2018   PFIZER(Purple Top)SARS-COV-2 Vaccination 12/26/2019, 01/26/2020   Pneumococcal Polysaccharide-23 03/24/2019   Tdap 12/20/2014    Advised yearly flu shot in the fall   Screening for cancer: Colon cancer screening: Age 40  Testicular cancer screening You should do a monthly self testicular exam if you are between 40-71 years old  We discussed PSA, prostate exam, and prostate cancer screening risks/benefits.   Age 40  Skin cancer screening: Check your skin regularly for new changes, growing lesions, or other lesions of concern Come in for evaluation if you have skin lesions of concern.  Lung cancer screening: If you have a greater than 20 pack year history of tobacco use, then you may qualify for lung cancer screening with a chest CT scan.   Please call your insurance company to inquire about coverage for this test.  We currently don't have screenings for other cancers besides breast, cervical, colon, and lung cancers.  If you have a strong family history of cancer or have other cancer screening concerns, please let me know.    Bone health: Get at  least 150 minutes of aerobic exercise weekly Get weight bearing exercise at least once weekly Bone density test:  A bone density test is an imaging test that uses a type of X-ray to measure the amount of calcium and other minerals in your bones. The test may be used to diagnose or screen you for a condition that causes weak or thin bones (osteoporosis), predict your risk for a broken bone (fracture), or determine how well your osteoporosis treatment is working. The bone density test is recommended for females 40 and older, or females or males [id number] if certain risk factors such as thyroid disease, long term use of steroids such as for asthma or rheumatological issues, vitamin D deficiency, estrogen deficiency, family history of osteoporosis, self or family history of fragility fracture in first degree relative.    Heart health: Get at least 150 minutes of aerobic exercise weekly Limit alcohol It is important to maintain a healthy blood pressure and healthy cholesterol numbers  Heart disease screening: Screening for heart disease includes screening for blood pressure, fasting lipids, glucose/diabetes screening, BMI height to weight ratio, reviewed of smoking status, physical activity, and diet.    Goals include blood pressure 120/80 or less, maintaining a healthy lipid/cholesterol profile, preventing diabetes or keeping diabetes numbers under good control, not smoking or using tobacco products, exercising most days per week or at least 150 minutes per week of exercise, and eating healthy variety of fruits and vegetables, healthy oils, and avoiding unhealthy food choices like fried food, fast food, high sugar and high cholesterol foods.    Other tests may possibly include EKG test, CT coronary calcium score, echocardiogram, exercise treadmill  stress test.   We discussed doing CT coronary calcium score.  He will consider and let me know    Medical care options: I recommend you continue to seek  care here first for routine care.  We try really hard to have available appointments Monday through Friday daytime hours for sick visits, acute visits, and physicals.  Urgent care should be used for after hours and weekends for significant issues that cannot wait till the next day.  The emergency department should be used for significant potentially life-threatening emergencies.  The emergency department is expensive, can often have long wait times for less significant concerns, so try to utilize primary care, urgent care, or telemedicine when possible to avoid unnecessary trips to the emergency department.  Virtual visits and telemedicine have been introduced since the pandemic started in 2020, and can be convenient ways to receive medical care.  We offer virtual appointments as well to assist you in a variety of options to seek medical care.   Separate significant issues discussed: HTN -continue current medication, routine labs today  Hyperlipidemia and fatty liver disease-continue current medications Crestor and Lovaza  Vitamin D deficiency-he is currently using vitamin D 2000 units daily.  Labs today  Tobacco use - advised cessation.  Baseline PFT today didn't quite work like we wanted, but suggests some decrease in lung function.     Glad to hear he is still sober, he continues with AA meetings  Psoriasis - no recent concerns  Obesity - work on efforts to lose weight through health diet and exercise

## 2021-05-31 ENCOUNTER — Other Ambulatory Visit: Payer: Self-pay | Admitting: Medical

## 2021-05-31 LAB — LIPID PANEL
Chol/HDL Ratio: 2.8 ratio (ref 0.0–5.0)
Cholesterol, Total: 111 mg/dL (ref 100–199)
HDL: 40 mg/dL (ref >39)
LDL Chol Calc (NIH): 57 mg/dL (ref 0–99)
Triglycerides: 66 mg/dL (ref 0–149)
VLDL Cholesterol Cal: 14 mg/dL (ref 5–40)

## 2021-05-31 LAB — CBC WITH DIFFERENTIAL/PLATELET
Basophils Absolute: 0.1 10*3/uL (ref 0.0–0.2)
Basos: 1 %
EOS (ABSOLUTE): 0.4 10*3/uL (ref 0.0–0.4)
Eos: 4 %
Hematocrit: 49.3 % (ref 37.5–51.0)
Hemoglobin: 16.7 g/dL (ref 13.0–17.7)
Immature Grans (Abs): 0 10*3/uL (ref 0.0–0.1)
Immature Granulocytes: 0 %
Lymphocytes Absolute: 3.4 10*3/uL — ABNORMAL HIGH (ref 0.7–3.1)
Lymphs: 39 %
MCH: 31.2 pg (ref 26.6–33.0)
MCHC: 33.9 g/dL (ref 31.5–35.7)
MCV: 92 fL (ref 79–97)
Monocytes Absolute: 0.4 10*3/uL (ref 0.1–0.9)
Monocytes: 5 %
Neutrophils Absolute: 4.4 10*3/uL (ref 1.4–7.0)
Neutrophils: 51 %
Platelets: 220 10*3/uL (ref 150–450)
RBC: 5.35 x10E6/uL (ref 4.14–5.80)
RDW: 12.7 % (ref 11.6–15.4)
WBC: 8.6 10*3/uL (ref 3.4–10.8)

## 2021-05-31 LAB — COMPREHENSIVE METABOLIC PANEL
ALT: 40 IU/L (ref 0–44)
AST: 24 IU/L (ref 0–40)
Albumin/Globulin Ratio: 2.6 — ABNORMAL HIGH (ref 1.2–2.2)
Albumin: 5.2 g/dL — ABNORMAL HIGH (ref 4.0–5.0)
Alkaline Phosphatase: 55 IU/L (ref 44–121)
BUN/Creatinine Ratio: 13 (ref 9–20)
BUN: 11 mg/dL (ref 6–20)
Bilirubin Total: 0.9 mg/dL (ref 0.0–1.2)
CO2: 23 mmol/L (ref 20–29)
Calcium: 10 mg/dL (ref 8.7–10.2)
Chloride: 102 mmol/L (ref 96–106)
Creatinine, Ser: 0.82 mg/dL (ref 0.76–1.27)
Globulin, Total: 2 g/dL (ref 1.5–4.5)
Glucose: 99 mg/dL (ref 65–99)
Potassium: 4.3 mmol/L (ref 3.5–5.2)
Sodium: 141 mmol/L (ref 134–144)
Total Protein: 7.2 g/dL (ref 6.0–8.5)
eGFR: 115 mL/min/{1.73_m2} (ref >59)

## 2021-05-31 LAB — URINALYSIS
Bilirubin, UA: NEGATIVE
Glucose, UA: NEGATIVE
Ketones, UA: NEGATIVE
Leukocytes,UA: NEGATIVE
Nitrite, UA: NEGATIVE
Protein,UA: NEGATIVE
RBC, UA: NEGATIVE
Specific Gravity, UA: 1.011 (ref 1.005–1.030)
Urobilinogen, Ur: 0.2 mg/dL (ref 0.2–1.0)
pH, UA: 6.5 (ref 5.0–7.5)

## 2021-05-31 LAB — VITAMIN D 25 HYDROXY (VIT D DEFICIENCY, FRACTURES): Vit D, 25-Hydroxy: 45.4 ng/mL (ref 30.0–100.0)

## 2021-05-31 MED ORDER — VITAMIN D 50 MCG (2000 UT) PO CAPS: 1.0000 | ORAL_CAPSULE | Freq: Every day | ORAL | 3 refills | Status: DC

## 2021-05-31 MED ORDER — EPINEPHRINE 0.3 MG/0.3ML IJ SOAJ
0.3000 mg | Freq: Once | INTRAMUSCULAR | 1 refills | Status: DC | PRN
Start: 2021-05-31 — End: 2022-06-13

## 2021-05-31 MED ORDER — HYDROCHLOROTHIAZIDE 25 MG PO TABS: 25.0000 mg | ORAL_TABLET | Freq: Every day | ORAL | 3 refills | Status: DC

## 2021-06-20 ENCOUNTER — Other Ambulatory Visit: Payer: Self-pay

## 2021-06-20 ENCOUNTER — Ambulatory Visit: Payer: BC Managed Care – PPO | Admitting: Podiatry

## 2021-06-20 DIAGNOSIS — L600 Ingrowing nail: Secondary | ICD-10-CM

## 2021-06-20 DIAGNOSIS — R52 Pain, unspecified: Secondary | ICD-10-CM

## 2021-06-20 DIAGNOSIS — Q828 Other specified congenital malformations of skin: Secondary | ICD-10-CM | POA: Diagnosis not present

## 2021-06-20 NOTE — Patient Instructions (Signed)

## 2021-06-21 NOTE — Progress Notes (Signed)
  Subjective:  Patient ID: Wesley Cruz, male    DOB: Jul 01, 1981,  MRN: [id number]  Chief Complaint  Patient presents with   Ingrown Toenail     ingrown toenail left foot    40 y.o. male returns for follow-up with the above complaint. History confirmed with patient.  Calluses become painful again he thinks there is some ingrown nail that has returned  Objective:  Physical Exam: warm, good capillary refill, no trophic changes or ulcerative lesions, normal DP and PT pulses and normal sensory exam. Left Foot: Previous matricectomy site has a retained medial piece of nail plate in the mid section of the nail fold, no nail regrowth approximately it appears porokeratosis returned slightly tender and smaller than previous   Assessment:   1. Porokeratosis   2. Pain   3. Ingrowing right great toenail      Plan:  Patient was evaluated and treated and all questions answered.  All symptomatic hyperkeratoses were safely debrided with a sterile #15 blade to patient's level of comfort without incident. We discussed preventative and palliative care of these lesions including supportive and accommodative shoegear, padding, prefabricated and custom molded accommodative orthoses, use of a pumice stone and lotions/creams daily.  Did not have Cantharone to applied today as it has been backordered we will have this at his next visit when he returns a few months.  Discussed with him we may need to do this every few months to maintain pain relief for the lesion.  We also discussed possibility of custom molded orthoses to offload the lesion  Ingrown Nail, left -Appear to have a small retained piece of nail in the medial fold, this had been extended out with nail growth of the central portion.  Was not able to see any nail recurrence proximally.  He did require anesthesia with a digital block of 3 cc lidocaine to get the offending border out but did not require repeat matricectomy.  We will consider this in  the future if it returns   Return if symptoms worsen or fail to improve.

## 2021-07-07 ENCOUNTER — Other Ambulatory Visit: Payer: Self-pay | Admitting: Medical

## 2021-08-14 ENCOUNTER — Other Ambulatory Visit: Payer: Self-pay

## 2021-08-14 ENCOUNTER — Telehealth: Payer: BC Managed Care – PPO | Admitting: Medical

## 2021-08-14 VITALS — Wt 247.0 lb

## 2021-08-14 DIAGNOSIS — J988 Other specified respiratory disorders: Secondary | ICD-10-CM | POA: Diagnosis not present

## 2021-08-14 DIAGNOSIS — R051 Acute cough: Secondary | ICD-10-CM

## 2021-08-14 MED ORDER — OXYMETAZOLINE HCL 0.05 % NA SOLN: 1.0000 | Freq: Two times a day (BID) | NASAL | 0 refills | Status: DC

## 2021-08-14 MED ORDER — EMERGEN-C IMMUNE PLUS PO PACK: 1.0000 | PACK | Freq: Two times a day (BID) | ORAL | 0 refills | Status: DC

## 2021-08-14 MED ORDER — PROMETHAZINE-DM 6.25-15 MG/5ML PO SYRP: 5.0000 mL | ORAL_SOLUTION | Freq: Four times a day (QID) | ORAL | 0 refills | Status: DC | PRN

## 2021-08-14 NOTE — Progress Notes (Signed)
Subjective:     Patient ID: Wesley Cruz, male   DOB: 09-03-1981, 40 y.o.   MRN: [id number]  This visit type was conducted due to national recommendations for restrictions regarding the COVID-19 Pandemic (e.g. social distancing) in an effort to limit this patient's exposure and mitigate transmission in our community.  Due to their co-morbid illnesses, this patient is at least at moderate risk for complications without adequate follow up.  This format is felt to be most appropriate for this patient at this time.    Documentation for virtual audio and video telecommunications through [place] encounter:  The patient was located at home. The provider was located in the office. The patient did consent to this visit and is aware of possible charges through their insurance for this visit.  The other persons participating in this telemedicine service were none. Time spent on call was 20 minutes and in review of previous records 20 minutes total.  This virtual service is not related to other E/M service within previous 7 days.   HPI Chief Complaint  Patient presents with   URI    Sinus pressure, drainage, cough, runny nose, ST, swollen lymph nodes. X 2 days. Wife and daughter had symptoms and they negative for covid.     Virtual consult for illness.   Hard to breath through nose.   Symptoms began yesterday.   No fever.   Has sore throat, quite a bit of cough.  Some headaches.  No body aches or chillls.   No NVD.  Getting dark yellow discharge from nose.   Throat feels swollen.  Has some ear and sinus pressure.    Wife Belenda Cruise and son Loanne Drilling both had similar illness over the weekend.   Wife was prescribed prednisone by Dr. On demand.  He has not had any testing yet.  No other aggravating or relieving factors. No other complaint.  Past Medical History:  Diagnosis Date   Anal skin tag    Elevated LFTs    2017 - 2021 ; abnormal [place], negative hepatitis infection 2017 labs   History of  alcohol abuse    16 years of drinking; went through residential treatment program 2018   Hyperlipidemia 6/15   Hypertension 6/15   Seizure ([place]) 2009   questionable seizure, syncope, one episode, none since - seen at [facility] ED    Current Outpatient Medications on File Prior to Visit  Medication Sig Dispense Refill   Cholecalciferol (VITAMIN D) 50 MCG (2000 UT) CAPS Take 1 capsule (2,000 Units total) by mouth daily. 90 capsule 3   EPINEPHrine (EPIPEN 2-PAK) 0.3 mg/0.3 mL IJ SOAJ injection Inject 0.3 mg into the muscle once as needed (for severe allergic reaction). CAll 911 immediately if you have to use this medicine 1 each 1   hydrochlorothiazide (HYDRODIURIL) 25 MG tablet Take 1 tablet (25 mg total) by mouth daily. 90 tablet 3   omega-3 acid ethyl esters (LOVAZA) 1 g capsule TAKE 2 CAPSULES BY MOUTH TWICE DAILY 120 capsule 1   rosuvastatin (CRESTOR) 40 MG tablet TAKE 1 TABLET(40 MG) BY MOUTH DAILY 90 tablet 0   No current facility-administered medications on file prior to visit.     Review of Systems As in subjective    Objective:   Physical Exam Due to coronavirus pandemic stay at home measures, patient visit was virtual and they were not examined in person.   Wt 247 lb (112 kg)   BMI 30.87 kg/m   General: Well-developed well-nourished no acute distress,  stopped up sounding in the head No labored breathing, no obvious wheezing     Assessment:     Encounter Diagnoses  Name Primary?   Acute cough Yes   Respiratory tract infection        Plan:     We discussed symptoms, concerns, possible differential.  He declines COVID or flu testing today.  Family members have had negative COVID test.  No RSV exposure.  He has 1 child in daycare but no other daycare exposures that are ill  Advise rest, hydration, can use cough medicine as below as needed.  Discussed nasal saline.  Advised over-the-counter Afrin short-term to help with nasal congestion particular at bedtime.  If any new  or worse symptoms in the next few days then call back   Alrick was seen today for uri.  Diagnoses and all orders for this visit:  Acute cough  Respiratory tract infection  Other orders -     Multiple Vitamins-Minerals (EMERGEN-C IMMUNE PLUS) PACK; Take 1 tablet by mouth 2 (two) times daily. -     promethazine-dextromethorphan (PROMETHAZINE-DM) 6.25-15 MG/5ML syrup; Take 5 mLs by mouth 4 (four) times daily as needed for cough. -     oxymetazoline (AFRIN NASAL SPRAY) 0.05 % nasal spray; Place 1 spray into both nostrils 2 (two) times daily.  F/u prn

## 2021-08-20 ENCOUNTER — Other Ambulatory Visit: Payer: Self-pay | Admitting: Medical

## 2021-09-05 ENCOUNTER — Other Ambulatory Visit: Payer: Self-pay | Admitting: Medical

## 2021-11-02 ENCOUNTER — Other Ambulatory Visit: Payer: Self-pay | Admitting: Medical

## 2021-11-24 ENCOUNTER — Ambulatory Visit: Payer: BC Managed Care – PPO | Admitting: Medical

## 2021-11-30 ENCOUNTER — Ambulatory Visit: Payer: BC Managed Care – PPO | Admitting: Medical

## 2021-12-01 ENCOUNTER — Ambulatory Visit: Payer: BC Managed Care – PPO | Admitting: Medical

## 2021-12-01 ENCOUNTER — Other Ambulatory Visit: Payer: Self-pay

## 2021-12-01 VITALS — BP 122/80 | HR 61 | Wt 248.0 lb

## 2021-12-01 DIAGNOSIS — L409 Psoriasis, unspecified: Secondary | ICD-10-CM

## 2021-12-01 DIAGNOSIS — E785 Hyperlipidemia, unspecified: Secondary | ICD-10-CM

## 2021-12-01 DIAGNOSIS — F1021 Alcohol dependence, in remission: Secondary | ICD-10-CM

## 2021-12-01 DIAGNOSIS — F17200 Nicotine dependence, unspecified, uncomplicated: Secondary | ICD-10-CM | POA: Diagnosis not present

## 2021-12-01 DIAGNOSIS — Z79899 Other long term (current) drug therapy: Secondary | ICD-10-CM | POA: Diagnosis not present

## 2021-12-01 DIAGNOSIS — E669 Obesity, unspecified: Secondary | ICD-10-CM | POA: Insufficient documentation

## 2021-12-01 DIAGNOSIS — E559 Vitamin D deficiency, unspecified: Secondary | ICD-10-CM | POA: Diagnosis not present

## 2021-12-01 DIAGNOSIS — I10 Essential (primary) hypertension: Secondary | ICD-10-CM

## 2021-12-01 DIAGNOSIS — L84 Corns and callosities: Secondary | ICD-10-CM

## 2021-12-01 LAB — COMPREHENSIVE METABOLIC PANEL
ALT: 31 IU/L (ref 0–44)
AST: 17 IU/L (ref 0–40)
Albumin/Globulin Ratio: 2.5 — ABNORMAL HIGH (ref 1.2–2.2)
Albumin: 4.8 g/dL (ref 4.0–5.0)
Alkaline Phosphatase: 62 IU/L (ref 44–121)
BUN/Creatinine Ratio: 19 (ref 9–20)
BUN: 14 mg/dL (ref 6–24)
Bilirubin Total: 0.6 mg/dL (ref 0.0–1.2)
CO2: 23 mmol/L (ref 20–29)
Calcium: 9.5 mg/dL (ref 8.7–10.2)
Chloride: 103 mmol/L (ref 96–106)
Creatinine, Ser: 0.75 mg/dL — ABNORMAL LOW (ref 0.76–1.27)
Globulin, Total: 1.9 g/dL (ref 1.5–4.5)
Glucose: 94 mg/dL (ref 70–99)
Potassium: 4.6 mmol/L (ref 3.5–5.2)
Sodium: 140 mmol/L (ref 134–144)
Total Protein: 6.7 g/dL (ref 6.0–8.5)
eGFR: 117 mL/min/{1.73_m2} (ref >59)

## 2021-12-01 NOTE — Progress Notes (Signed)
Subjective:  Wesley Cruz is a 41 y.o. male who presents for Chief Complaint  Patient presents with   6 month follow-up    6 month follow-up on medication for cholesterol and then would like foot looked at   Here for med check.    BMI >30 - Does calisthenics, pushups , situps , basketball, golf.   Feels like he is eating a good variety and less portions, trying to eat healthy.  Eats yogurt and berries for breakfast with collagen.  This has helped his knees. Eats grilled chicken at lunch.    Dyslipidemia - taking 3 fish oil capsules once daily and crestor daily.   Been eating less ice cream  Coming up on 5 years of sobriety.  Still smoking though.  Compliant with vit D supplement  HTN - compliant with hydrochlorothiazide  Psoriasis - sees derm, uses 1 low risk cream for eyelids, triamincinoilne cream for elbows.  No other aggravating or relieving factors.    No other c/o.  Past Medical History:  Diagnosis Date   Anal skin tag    Elevated LFTs    2017 - 2021 ; abnormal [place], negative hepatitis infection 2017 labs   History of alcohol abuse    16 years of drinking; went through residential treatment program 2018   Hyperlipidemia 6/15   Hypertension 6/15   Seizure ([place]) 2009   questionable seizure, syncope, one episode, none since - seen at [facility] ED    Current Outpatient Medications on File Prior to Visit  Medication Sig Dispense Refill   Cholecalciferol (VITAMIN D) 50 MCG (2000 UT) CAPS Take 1 capsule (2,000 Units total) by mouth daily. 90 capsule 3   EPINEPHrine (EPIPEN 2-PAK) 0.3 mg/0.3 mL IJ SOAJ injection Inject 0.3 mg into the muscle once as needed (for severe allergic reaction). CAll 911 immediately if you have to use this medicine 1 each 1   hydrochlorothiazide (HYDRODIURIL) 25 MG tablet Take 1 tablet (25 mg total) by mouth daily. 90 tablet 3   Multiple Vitamins-Minerals (EMERGEN-C IMMUNE PLUS) PACK Take 1 tablet by mouth 2 (two) times daily. 10 each 0   omega-3 acid  ethyl esters (LOVAZA) 1 g capsule TAKE 2 CAPSULES BY MOUTH TWICE DAILY (Patient taking differently: Take 3 g by mouth daily.) 120 capsule 1   rosuvastatin (CRESTOR) 40 MG tablet TAKE 1 TABLET(40 MG) BY MOUTH DAILY 90 tablet 0   No current facility-administered medications on file prior to visit.     The following portions of the patient's history were reviewed and updated as appropriate: allergies, current medications, past family history, past medical history, past social history, past surgical history and problem list.  ROS Otherwise as in subjective above   Objective: BP 122/80    Pulse 61    Wt 248 lb (112.5 kg)    BMI 31.00 kg/m   Wt Readings from Last 3 Encounters:  12/01/21 248 lb (112.5 kg)  08/14/21 247 lb (112 kg)  05/30/21 246 lb (111.6 kg)    General appearance: alert, no distress, well developed, well nourished Left foot volar surface MTP of 3rd digit with corn, smaller than prior Pulses: 2+ radial pulses, 2+ pedal pulses, normal cap refill Ext: no edema   Assessment: Encounter Diagnoses  Name Primary?   Vitamin D deficiency Yes   Smoker    Recovering alcoholic ([place])    Psoriasis    Hyperlipidemia, unspecified hyperlipidemia type    Essential hypertension    High risk medication use  Corn of foot    Obesity, Class I, BMI 30-34.9      Plan: Vitamin D deficiency-continue supplement, comprehensive metabolic lab today for surveillance for calcium and liver  Smoker-he continues to smoke less than prior.  Continue efforts to gradually quit  Almost 5 years sober  Psoriasis-sees dermatology, currently using 2 separate creams and doing pretty good with this keeping symptoms at [place]  Hyperlipidemia-his last lipid panel in August 2020 to look good.  Continue fish oil and statin  Hypertension-work on losing weight to possibly drop down dose of blood pressure medication  BMI greater than 30-work on efforts to lose weight through calorie restriction.  He is  exercising a lot but needs to work on cutting back some calories  Corn of foot-continue topical treatment.  I used a sterile razor to shave off the superficial skin over the corn today so his topical treatment can penetrate better.  [name] was seen today for 6 month follow-up.  Diagnoses and all orders for this visit:  Vitamin D deficiency -     Comprehensive metabolic panel  Smoker  Recovering alcoholic ([place])  Psoriasis  Hyperlipidemia, unspecified hyperlipidemia type  Essential hypertension -     Comprehensive metabolic panel  High risk medication use -     Comprehensive metabolic panel  Corn of foot  Obesity, Class I, BMI 30-34.9   Follow up: yearly for physical

## 2021-12-25 ENCOUNTER — Telehealth: Payer: Self-pay | Admitting: Medical

## 2021-12-25 ENCOUNTER — Other Ambulatory Visit: Payer: Self-pay | Admitting: Medical

## 2021-12-25 NOTE — Telephone Encounter (Signed)
Pt called and is requesting a refill on his crestor states he does not have a refill on rx it was denied and that he needed to call his pcp to get it resent, when he got the rx in jan they could only do 30 because they where out of stock  ?

## 2021-12-25 NOTE — Telephone Encounter (Signed)
Pt was notified that [facility] is going to fill the other 60 tablets on file for him and he can go pick up today ?

## 2022-01-02 DIAGNOSIS — L218 Other seborrheic dermatitis: Secondary | ICD-10-CM | POA: Diagnosis not present

## 2022-01-02 DIAGNOSIS — L400 Psoriasis vulgaris: Secondary | ICD-10-CM | POA: Diagnosis not present

## 2022-01-02 DIAGNOSIS — L710 Perioral dermatitis: Secondary | ICD-10-CM | POA: Diagnosis not present

## 2022-01-02 DIAGNOSIS — D225 Melanocytic nevi of trunk: Secondary | ICD-10-CM | POA: Diagnosis not present

## 2022-01-09 ENCOUNTER — Other Ambulatory Visit: Payer: Self-pay

## 2022-01-09 ENCOUNTER — Ambulatory Visit: Payer: BC Managed Care – PPO | Admitting: Podiatry

## 2022-01-09 DIAGNOSIS — R52 Pain, unspecified: Secondary | ICD-10-CM | POA: Diagnosis not present

## 2022-01-09 DIAGNOSIS — M7742 Metatarsalgia, left foot: Secondary | ICD-10-CM

## 2022-01-09 DIAGNOSIS — Q828 Other specified congenital malformations of skin: Secondary | ICD-10-CM

## 2022-01-09 DIAGNOSIS — L84 Corns and callosities: Secondary | ICD-10-CM | POA: Diagnosis not present

## 2022-01-09 DIAGNOSIS — M7741 Metatarsalgia, right foot: Secondary | ICD-10-CM

## 2022-01-12 ENCOUNTER — Encounter: Payer: Self-pay | Admitting: Podiatry

## 2022-01-12 NOTE — Progress Notes (Signed)
?  Subjective:  ?Patient ID: Wesley Cruz, male    DOB: 30-Jun-1981,  MRN: [id number] ? ?Chief Complaint  ?Patient presents with  ? Callouses  ?  Painful callus lesion  ? ? ?41 y.o. male returns for follow-up with the above complaint. History confirmed with patient.  The painful area has returned again and he is going on a golf trip next week ? ?Objective:  ?Physical Exam: ?warm, good capillary refill, no trophic changes or ulcerative lesions, normal DP and PT pulses and normal sensory exam. ?Left Foot: Recurrence of porokeratosis and midfoot porokeratosis still quite painful to touch, prominent metatarsal heads ? ? ?Assessment:  ? ?1. Metatarsalgia of both feet   ?2. Porokeratosis   ?3. Pain   ?4. Callus of foot   ? ? ? ?Plan:  ?Patient was evaluated and treated and all questions answered. ? ?All symptomatic hyperkeratoses were safely debrided with a sterile #15 blade to patient's level of comfort without incident. We discussed preventative and palliative care of these lesions including supportive and accommodative shoegear, padding, prefabricated and custom molded accommodative orthoses, use of a pumice stone and lotions/creams daily. ? ?Do not apply Cantharone or salicylic acid today as he has an upcoming golf trip and I think blistering of the area will be quite painful for him if we do this today.  We discussed offloading the area to prevent recurrence again and I think he would really benefit from a custom molded multidensity insole with an unload under the porokeratosis and metatarsal pad proximal to this.  I gave him the CPT codes for custom molded orthoses and he will check with his carrier if he has benefits for this.  I will see him back in the fall to debride the lesion again.  After he checks with his insurance he will call to make a appointment with our orthotist for orthotics fitting ? ? ?Return in about 8 months (around 09/11/2022).  ?

## 2022-02-27 ENCOUNTER — Telehealth: Payer: Self-pay | Admitting: Medical

## 2022-02-27 MED ORDER — ROSUVASTATIN CALCIUM 40 MG PO TABS: ORAL_TABLET | ORAL | 0 refills | Status: DC

## 2022-02-27 NOTE — Telephone Encounter (Signed)
Pt called and is requesting a refill for his crestor please send to the [id number] - [place], [place] ?

## 2022-02-27 NOTE — Telephone Encounter (Signed)
refilled 

## 2022-03-25 ENCOUNTER — Other Ambulatory Visit: Payer: Self-pay | Admitting: Medical

## 2022-03-27 ENCOUNTER — Ambulatory Visit (INDEPENDENT_AMBULATORY_CARE_PROVIDER_SITE_OTHER): Payer: BC Managed Care – PPO | Admitting: Podiatry

## 2022-03-27 ENCOUNTER — Encounter: Payer: Self-pay | Admitting: Podiatry

## 2022-03-27 DIAGNOSIS — M7742 Metatarsalgia, left foot: Secondary | ICD-10-CM

## 2022-03-27 DIAGNOSIS — Q828 Other specified congenital malformations of skin: Secondary | ICD-10-CM | POA: Diagnosis not present

## 2022-03-27 DIAGNOSIS — R52 Pain, unspecified: Secondary | ICD-10-CM | POA: Diagnosis not present

## 2022-03-27 DIAGNOSIS — M7741 Metatarsalgia, right foot: Secondary | ICD-10-CM

## 2022-03-27 NOTE — Progress Notes (Signed)
  Subjective:  Patient ID: Wesley Cruz, male    DOB: Nov 22, 1980,  MRN: [id number]  Chief Complaint  Patient presents with   Callouses       left foot pain from nodule on foot    41 y.o. male returns for follow-up with the above complaint. History confirmed with patient.  The painful area has returned again was doing well until about last week  Objective:  Physical Exam: warm, good capillary refill, no trophic changes or ulcerative lesions, normal DP and PT pulses and normal sensory exam. Left Foot: Recurrence of porokeratosis and midfoot porokeratosis still quite painful to touch, prominent metatarsal heads   Assessment:   1. Porokeratosis   2. Metatarsalgia of both feet   3. Pain      Plan:  Patient was evaluated and treated and all questions answered.  All symptomatic hyperkeratoses were safely debrided with a sterile #15 blade to patient's level of comfort without incident. We discussed preventative and palliative care of these lesions including supportive and accommodative shoegear, padding, prefabricated and custom molded accommodative orthoses, use of a pumice stone and lotions/creams daily.  Did not apply any destructive salicylic acid or Cantharone today.  We also discussed doing this aggressively again with Cantharone again in the fall when he is less busy and not playing as much golf.  We also discussed the option of possible punch biopsy to excise this over the winter at some point   Return in about 6 months (around 09/26/2022) for skin lesion treatment.

## 2022-04-24 ENCOUNTER — Other Ambulatory Visit: Payer: Self-pay | Admitting: Medical

## 2022-05-24 ENCOUNTER — Other Ambulatory Visit: Payer: Self-pay | Admitting: Medical

## 2022-05-26 DIAGNOSIS — M9901 Segmental and somatic dysfunction of cervical region: Secondary | ICD-10-CM | POA: Diagnosis not present

## 2022-05-26 DIAGNOSIS — M9906 Segmental and somatic dysfunction of lower extremity: Secondary | ICD-10-CM | POA: Diagnosis not present

## 2022-05-26 DIAGNOSIS — S93401A Sprain of unspecified ligament of right ankle, initial encounter: Secondary | ICD-10-CM | POA: Diagnosis not present

## 2022-05-26 DIAGNOSIS — M9902 Segmental and somatic dysfunction of thoracic region: Secondary | ICD-10-CM | POA: Diagnosis not present

## 2022-05-26 DIAGNOSIS — M9905 Segmental and somatic dysfunction of pelvic region: Secondary | ICD-10-CM | POA: Diagnosis not present

## 2022-05-26 DIAGNOSIS — M9903 Segmental and somatic dysfunction of lumbar region: Secondary | ICD-10-CM | POA: Diagnosis not present

## 2022-06-12 ENCOUNTER — Ambulatory Visit (INDEPENDENT_AMBULATORY_CARE_PROVIDER_SITE_OTHER): Payer: BC Managed Care – PPO | Admitting: Medical

## 2022-06-12 ENCOUNTER — Encounter: Payer: Self-pay | Admitting: Medical

## 2022-06-12 VITALS — BP 120/80 | HR 83 | Ht 75.0 in | Wt 242.0 lb

## 2022-06-12 DIAGNOSIS — I10 Essential (primary) hypertension: Secondary | ICD-10-CM

## 2022-06-12 DIAGNOSIS — E785 Hyperlipidemia, unspecified: Secondary | ICD-10-CM

## 2022-06-12 DIAGNOSIS — K760 Fatty (change of) liver, not elsewhere classified: Secondary | ICD-10-CM

## 2022-06-12 DIAGNOSIS — Z6830 Body mass index (BMI) 30.0-30.9, adult: Secondary | ICD-10-CM

## 2022-06-12 DIAGNOSIS — Z0000 Encounter for general adult medical examination without abnormal findings: Secondary | ICD-10-CM

## 2022-06-12 DIAGNOSIS — Z7185 Encounter for immunization safety counseling: Secondary | ICD-10-CM

## 2022-06-12 DIAGNOSIS — L409 Psoriasis, unspecified: Secondary | ICD-10-CM | POA: Diagnosis not present

## 2022-06-12 DIAGNOSIS — F1011 Alcohol abuse, in remission: Secondary | ICD-10-CM

## 2022-06-12 DIAGNOSIS — Z23 Encounter for immunization: Secondary | ICD-10-CM | POA: Diagnosis not present

## 2022-06-12 DIAGNOSIS — E559 Vitamin D deficiency, unspecified: Secondary | ICD-10-CM | POA: Diagnosis not present

## 2022-06-12 DIAGNOSIS — Z889 Allergy status to unspecified drugs, medicaments and biological substances status: Secondary | ICD-10-CM

## 2022-06-12 LAB — POCT URINALYSIS DIP (PROADVANTAGE DEVICE)
Bilirubin, UA: NEGATIVE
Blood, UA: NEGATIVE
Glucose, UA: NEGATIVE mg/dL
Ketones, POC UA: NEGATIVE mg/dL
Leukocytes, UA: NEGATIVE
Nitrite, UA: NEGATIVE
Protein Ur, POC: NEGATIVE mg/dL
Specific Gravity, Urine: 1.015
Urobilinogen, Ur: NEGATIVE
pH, UA: 6.5 (ref 5.0–8.0)

## 2022-06-12 NOTE — Progress Notes (Signed)
Subjective:   HPI  Wesley Cruz is a 41 y.o. male who presents for Chief Complaint  Patient presents with   fasting cpe    Fasting cpe, having some lower back pain- no injury. No catching but having severe pain- stretching, flu shot given today    Patient Care Team: Swanson Farnell, Leward Quan as PCP - General (Family Medicine) Sees dentist Sees eye doctor Dr. Lanae Crumbly, podiatry Dr. Leighton Ruff, general surgery Dr.Henry Danis, and prior colonoscopy with Dr. Juanita Craver   Concerns: 5 years sober in May 2023.  Only c/o is acute back pain.  Started a week and a half ago.  He does not recall doing anything specific that would have aggravated his back.  No recent fall or injury.  Has seen a massage therapist and that did help and they also mention he has tight hamstrings.  No numbness, tingling, weakness incontinence or fever.  No urinary changes.  Reviewed their medical, surgical, family, social, medication, and allergy history and updated chart as appropriate.  Past Medical History:  Diagnosis Date   Acne    Anal skin tag    Elevated LFTs    2017 - 2021 ; abnormal [place], negative hepatitis infection 2017 labs   History of alcohol abuse    16 years of drinking; went through residential treatment program 2018; sober 5 years as of 05/2022   Hyperlipidemia 03/2014   Hypertension 03/2014   Seizure ([place]) 2009   questionable seizure, syncope, one episode, none since - seen at [facility] ED     Past Surgical History:  Procedure Laterality Date   COLONOSCOPY  2017   Dr. Collene Mares   EXCISION OF SKIN TAG N/A 09/01/2020   Procedure: EXCISION OF SKIN TAG;  Surgeon: Leighton Ruff, MD;  Location: [facility];  Service: General;  Laterality: N/A;   FISTULOTOMY N/A 09/01/2020   Procedure: FISTULOTOMY;  Surgeon: Leighton Ruff, MD;  Location: [facility];  Service: General;  Laterality: N/A;   LACERATION REPAIR  2009   right dorsal hand; s/p punching wall   RECTAL  EXAM UNDER ANESTHESIA N/A 09/01/2020   Procedure: anal EXAM UNDER ANESTHESIA;  Surgeon: Leighton Ruff, MD;  Location: [facility];  Service: General;  Laterality: N/A;    Family History  Problem Relation Age of Onset   Hypertension Father    Hypertension Paternal Uncle    Stroke Paternal Uncle 53   Stroke Paternal Uncle 34   Heart disease Paternal Uncle        CABG   Heart disease Paternal Uncle        CAD, stents   Cancer Paternal Grandmother        lung?   COPD Paternal Grandmother    COPD Paternal Grandfather    Diabetes Neg Hx      Current Outpatient Medications:    Cholecalciferol (VITAMIN D) 50 MCG (2000 UT) CAPS, Take 1 capsule (2,000 Units total) by mouth daily., Disp: 90 capsule, Rfl: 3   doxycycline (VIBRAMYCIN) 100 MG capsule, Take 100 mg by mouth daily., Disp: , Rfl:    EPINEPHrine (EPIPEN 2-PAK) 0.3 mg/0.3 mL IJ SOAJ injection, Inject 0.3 mg into the muscle once as needed (for severe allergic reaction). CAll 911 immediately if you have to use this medicine, Disp: 1 each, Rfl: 1   hydrochlorothiazide (HYDRODIURIL) 25 MG tablet, Take 1 tablet (25 mg total) by mouth daily., Disp: 90 tablet, Rfl: 3   Multiple Vitamins-Minerals (EMERGEN-C IMMUNE PLUS) PACK,  Take 1 tablet by mouth 2 (two) times daily., Disp: 10 each, Rfl: 0   omega-3 acid ethyl esters (LOVAZA) 1 g capsule, TAKE 2 CAPSULES BY MOUTH TWICE DAILY, Disp: 120 capsule, Rfl: 1   rosuvastatin (CRESTOR) 40 MG tablet, TAKE 1 TABLET(40 MG) BY MOUTH DAILY, Disp: 90 tablet, Rfl: 0   pimecrolimus (ELIDEL) 1 % cream, Apply topically., Disp: , Rfl:    triamcinolone cream (KENALOG) 0.1 %, Apply topically., Disp: , Rfl:   Allergies  Allergen Reactions   Hornet Venom Anaphylaxis    Japanese hornet   Lisinopril     cough     Review of Systems Constitutional: -fever, -chills, -sweats, -unexpected weight change, -decreased appetite, -fatigue Allergy: -sneezing, -itching, -congestion Dermatology: -changing  moles, --rash, -lumps ENT: -runny nose, -ear pain, -sore throat, -hoarseness, -sinus pain, -teeth pain, - ringing in ears, -hearing loss, -nosebleeds Cardiology: -chest pain, -palpitations, -swelling, -difficulty breathing when lying flat, -waking up short of breath Respiratory: -cough, -shortness of breath, -difficulty breathing with exercise or exertion, -wheezing, -coughing up blood Gastroenterology: -abdominal pain, -nausea, -vomiting, -diarrhea, -constipation, -blood in stool, -changes in bowel movement, -difficulty swallowing or eating Hematology: -bleeding, -bruising  Musculoskeletal: -joint aches, -muscle aches, -joint swelling, +back pain, -neck pain, -cramping, -changes in gait Ophthalmology: denies vision changes, eye redness, itching, discharge Urology: -burning with urination, -difficulty urinating, -blood in urine, -urinary frequency, -urgency, -incontinence Neurology: -headache, -weakness, -tingling, -numbness, -memory loss, -falls, -dizziness Psychology: -depressed mood, -agitation, -sleep problems Male GU: no testicular mass, pain, no lymph nodes swollen, no swelling, no rash.     06/12/2022    9:14 AM 12/01/2021    9:35 AM 05/30/2021    9:07 AM 05/09/2020    8:42 AM 03/24/2019    8:26 AM  Depression screen PHQ 2/9  Decreased Interest 0 0 0 0 0  Down, Depressed, Hopeless 0 0 0 0 0  PHQ - 2 Score 0 0 0 0 0        Objective:  BP 120/80   Pulse 83   Ht '6\' 3"'$  (1.905 m)   Wt 242 lb (109.8 kg)   BMI 30.25 kg/m   General appearance: alert, no distress, WD/WN, Caucasian male Skin: unremarkable HEENT: normocephalic, conjunctiva/corneas normal, sclerae anicteric, PERRLA, EOMi, nares patent, no discharge or erythema, pharynx normal Neck: supple, no lymphadenopathy, no thyromegaly, no masses, normal ROM, no bruits Chest: non tender, normal shape and expansion Heart: RRR, normal S1, S2, no murmurs Lungs: CTA bilaterally, no wheezes, rhonchi, or rales Abdomen: +bs, soft, non  tender, non distended, no masses, no hepatomegaly, no splenomegaly, no bruits Back: somewhat stiff and reduced ROM due to low back pain, otherwise non tender, no scoliosis Musculoskeletal:  upper extremities non tender, no obvious deformity, normal ROM throughout, lower extremities non tender, no obvious deformity, normal ROM throughout Extremities: no edema, no cyanosis, no clubbing Pulses: 2+ symmetric, upper and lower extremities, normal cap refill Neurological: alert, oriented x 3, CN2-12 intact, strength normal upper extremities and lower extremities, sensation normal throughout, DTRs 2+ throughout, no cerebellar signs, gait normal Psychiatric: normal affect, behavior normal, pleasant  GU: normal male external genitalia,circumcised, nontender, no masses, no hernia, no lymphadenopathy Rectal: deferred   Assessment and Plan :   Encounter Diagnoses  Name Primary?   Encounter for health maintenance examination in adult Yes   Vitamin D deficiency    Vaccine counseling    Psoriasis    BMI 30.0-30.9,adult    Essential hypertension    Fatty liver  History of alcohol abuse    Hyperlipidemia, unspecified hyperlipidemia type    History of allergic reaction    Needs flu shot      This visit was a preventative care visit, also known as wellness visit or routine physical.   Topics typically include healthy lifestyle, diet, exercise, preventative care, vaccinations, sick and well care, proper use of emergency dept and after hours care, as well as other concerns.     Recommendations: Continue to return yearly for your annual wellness and preventative care visits.  This gives [place] a chance to discuss healthy lifestyle, exercise, vaccinations, review your chart record, and perform screenings where appropriate.  I recommend you see your eye doctor yearly for routine vision care.  I recommend you see your dentist yearly for routine dental care including hygiene visits twice  yearly.   Vaccination recommendations were reviewed Immunization History  Administered Date(s) Administered   Influenza,inj,Quad PF,6+ Mos 08/07/2019, 06/12/2022   Influenza-Unspecified 08/22/2018, 06/22/2021   PFIZER(Purple Top)SARS-COV-2 Vaccination 12/26/2019, 01/26/2020   Pneumococcal Polysaccharide-23 03/24/2019   Tdap 12/20/2014    Counseled on the influenza virus vaccine.  Vaccine information sheet given.  Influenza vaccine given after consent obtained.   Screening for cancer: Colon cancer screening: Age 59  Testicular cancer screening You should do a monthly self testicular exam if you are between 17-69 years old  We discussed PSA, prostate exam, and prostate cancer screening risks/benefits.   Age 32  Skin cancer screening: Check your skin regularly for new changes, growing lesions, or other lesions of concern Come in for evaluation if you have skin lesions of concern.  Lung cancer screening: If you have a greater than 20 pack year history of tobacco use, then you may qualify for lung cancer screening with a chest CT scan.   Please call your insurance company to inquire about coverage for this test.  We currently don't have screenings for other cancers besides breast, cervical, colon, and lung cancers.  If you have a strong family history of cancer or have other cancer screening concerns, please let me know.    Bone health: Get at least 150 minutes of aerobic exercise weekly Get weight bearing exercise at least once weekly Bone density test:  A bone density test is an imaging test that uses a type of X-ray to measure the amount of calcium and other minerals in your bones. The test may be used to diagnose or screen you for a condition that causes weak or thin bones (osteoporosis), predict your risk for a broken bone (fracture), or determine how well your osteoporosis treatment is working. The bone density test is recommended for females 55 and older, or females or  males <94 if certain risk factors such as thyroid disease, long term use of steroids such as for asthma or rheumatological issues, vitamin D deficiency, estrogen deficiency, family history of osteoporosis, self or family history of fragility fracture in first degree relative.    Heart health: Get at least 150 minutes of aerobic exercise weekly Limit alcohol It is important to maintain a healthy blood pressure and healthy cholesterol numbers  Heart disease screening: Screening for heart disease includes screening for blood pressure, fasting lipids, glucose/diabetes screening, BMI height to weight ratio, reviewed of smoking status, physical activity, and diet.    Goals include blood pressure 120/80 or less, maintaining a healthy lipid/cholesterol profile, preventing diabetes or keeping diabetes numbers under good control, not smoking or using tobacco products, exercising most days per week or at least  150 minutes per week of exercise, and eating healthy variety of fruits and vegetables, healthy oils, and avoiding unhealthy food choices like fried food, fast food, high sugar and high cholesterol foods.    Other tests may possibly include EKG test, CT coronary calcium score, echocardiogram, exercise treadmill stress test.     Medical care options: I recommend you continue to seek care here first for routine care.  We try really hard to have available appointments Monday through Friday daytime hours for sick visits, acute visits, and physicals.  Urgent care should be used for after hours and weekends for significant issues that cannot wait till the next day.  The emergency department should be used for significant potentially life-threatening emergencies.  The emergency department is expensive, can often have long wait times for less significant concerns, so try to utilize primary care, urgent care, or telemedicine when possible to avoid unnecessary trips to the emergency department.  Virtual visits  and telemedicine have been introduced since the pandemic started in 2020, and can be convenient ways to receive medical care.  We offer virtual appointments as well to assist you in a variety of options to seek medical care.   Separate significant issues discussed: HTN -continue current medication HCTZ '25mg'$  daily, routine labs today  Hyperlipidemia and fatty liver disease-continue current medications Crestor and Lovaza  Vitamin D deficiency-he is currently using vitamin D 2000 units daily.  Labs today  Tobacco use - advised cessation.    Glad to hear he is still sober, he continues with AA meetings  Psoriasis - no recent concerns  Obesity - work on efforts to lose weight through health diet and exercise  Back pain -declines muscle laxer.  We discussed short period of rest and gentle range of motion activity.  Continue massage if desired.  Continue heat.  Advised using Aleve over-the-counter for the next 3 to 5 days.  Once symptoms have resolved, we discussed some exercises to do to keep the back strong.  In addition to push-ups and and sit ups he does regular, we discussed doing some core twist, rows and light weight dead lifts.  We discussed stretching regularly  Loman was seen today for fasting cpe.  Diagnoses and all orders for this visit:  Encounter for health maintenance examination in adult -     CBC -     Comprehensive metabolic panel -     Lipid panel -     POCT Urinalysis DIP (Proadvantage Device)  Vitamin D deficiency  Vaccine counseling  Psoriasis  BMI 30.0-30.9,adult  Essential hypertension  Fatty liver  History of alcohol abuse  Hyperlipidemia, unspecified hyperlipidemia type -     Lipid panel  History of allergic reaction  Needs flu shot -     Flu Vaccine QUAD 66moIM (Fluarix, Fluzone & Alfiuria Quad PF)    Follow-up pending labs, yearly for physical

## 2022-06-13 ENCOUNTER — Other Ambulatory Visit: Payer: Self-pay | Admitting: Medical

## 2022-06-13 LAB — CBC
Hematocrit: 48.9 % (ref 37.5–51.0)
Hemoglobin: 16.6 g/dL (ref 13.0–17.7)
MCH: 31.7 pg (ref 26.6–33.0)
MCHC: 33.9 g/dL (ref 31.5–35.7)
MCV: 93 fL (ref 79–97)
Platelets: 209 10*3/uL (ref 150–450)
RBC: 5.24 x10E6/uL (ref 4.14–5.80)
RDW: 12.7 % (ref 11.6–15.4)
WBC: 8.4 10*3/uL (ref 3.4–10.8)

## 2022-06-13 LAB — COMPREHENSIVE METABOLIC PANEL
ALT: 41 IU/L (ref 0–44)
AST: 25 IU/L (ref 0–40)
Albumin/Globulin Ratio: 2.5 — ABNORMAL HIGH (ref 1.2–2.2)
Albumin: 5.2 g/dL — ABNORMAL HIGH (ref 4.1–5.1)
Alkaline Phosphatase: 59 IU/L (ref 44–121)
BUN/Creatinine Ratio: 14 (ref 9–20)
BUN: 12 mg/dL (ref 6–24)
Bilirubin Total: 1.1 mg/dL (ref 0.0–1.2)
CO2: 24 mmol/L (ref 20–29)
Calcium: 10 mg/dL (ref 8.7–10.2)
Chloride: 102 mmol/L (ref 96–106)
Creatinine, Ser: 0.86 mg/dL (ref 0.76–1.27)
Globulin, Total: 2.1 g/dL (ref 1.5–4.5)
Glucose: 95 mg/dL (ref 70–99)
Potassium: 4.4 mmol/L (ref 3.5–5.2)
Sodium: 143 mmol/L (ref 134–144)
Total Protein: 7.3 g/dL (ref 6.0–8.5)
eGFR: 112 mL/min/{1.73_m2} (ref >59)

## 2022-06-13 LAB — LIPID PANEL
Chol/HDL Ratio: 2.5 ratio (ref 0.0–5.0)
Cholesterol, Total: 103 mg/dL (ref 100–199)
HDL: 41 mg/dL (ref >39)
LDL Chol Calc (NIH): 48 mg/dL (ref 0–99)
Triglycerides: 66 mg/dL (ref 0–149)
VLDL Cholesterol Cal: 14 mg/dL (ref 5–40)

## 2022-06-13 MED ORDER — EPINEPHRINE 0.3 MG/0.3ML IJ SOAJ
0.3000 mg | Freq: Once | INTRAMUSCULAR | 1 refills | Status: DC | PRN
Start: 2022-06-13 — End: 2022-10-11

## 2022-06-13 MED ORDER — OMEGA-3-ACID ETHYL ESTERS 1 G PO CAPS: 2.0000 | ORAL_CAPSULE | Freq: Two times a day (BID) | ORAL | 3 refills | Status: DC

## 2022-06-13 MED ORDER — HYDROCHLOROTHIAZIDE 25 MG PO TABS: 25.0000 mg | ORAL_TABLET | Freq: Every day | ORAL | 3 refills | Status: DC

## 2022-06-13 MED ORDER — VITAMIN D 50 MCG (2000 UT) PO CAPS: 1.0000 | ORAL_CAPSULE | Freq: Every day | ORAL | 3 refills | Status: DC

## 2022-07-10 ENCOUNTER — Encounter: Payer: Self-pay | Admitting: Internal Medicine

## 2022-07-10 ENCOUNTER — Encounter: Payer: Self-pay | Admitting: Podiatry

## 2022-07-12 ENCOUNTER — Encounter: Payer: Self-pay | Admitting: Medical

## 2022-07-30 ENCOUNTER — Telehealth: Payer: BC Managed Care – PPO | Admitting: Family Medicine

## 2022-07-30 ENCOUNTER — Encounter: Payer: Self-pay | Admitting: Family Medicine

## 2022-07-30 VITALS — Ht 75.0 in | Wt 242.0 lb

## 2022-07-30 DIAGNOSIS — J069 Acute upper respiratory infection, unspecified: Secondary | ICD-10-CM | POA: Diagnosis not present

## 2022-07-30 DIAGNOSIS — F17200 Nicotine dependence, unspecified, uncomplicated: Secondary | ICD-10-CM

## 2022-07-30 DIAGNOSIS — F17210 Nicotine dependence, cigarettes, uncomplicated: Secondary | ICD-10-CM | POA: Diagnosis not present

## 2022-07-30 DIAGNOSIS — F1021 Alcohol dependence, in remission: Secondary | ICD-10-CM | POA: Diagnosis not present

## 2022-07-30 NOTE — Progress Notes (Signed)
   Subjective:    Patient ID: Wesley Cruz, male    DOB: May 29, 1981, 41 y.o.   MRN: [id number]  HPI Documentation for virtual audio and video telecommunications through [place] encounter: The patient was located at home. 2 patient identifiers used.  The provider was located in the office. The patient did consent to this visit and is aware of possible charges through their insurance for this visit. The other persons participating in this telemedicine service were none. Time spent on call was 5 minutes and in review of previous records >15 minutes total for counseling and coordination of care. This virtual service is not related to other E/M service within previous 7 days.  He states that on Saturday he developed sinus pressure, postnasal drainage and rhinorrhea.  The next day he developed a slight cough with a sore throat but no fever, chills, earache.  He has no underlying allergies.  He does still smoke.  He did a recent COVID test which was negative.  He also states that he is now 5 years alcohol free  Review of Systems     Objective:   Physical Exam  Alert and in no distress otherwise not examined      Assessment & Plan:  Viral upper respiratory tract infection  Smoker  Recovering alcoholic in remission ([place]) Recommended conservative care with gargling, Tylenol, judicious use of Afrin and also nasal steroids. I congratulated him on being 5 years alcohol free. Explained that it would takes 7 to 10 days to get back to normal and if there is any questions he will call back.

## 2022-07-31 ENCOUNTER — Encounter: Payer: Self-pay | Admitting: Internal Medicine

## 2022-08-05 ENCOUNTER — Other Ambulatory Visit: Payer: Self-pay | Admitting: Family Medicine

## 2022-08-05 DIAGNOSIS — J069 Acute upper respiratory infection, unspecified: Secondary | ICD-10-CM

## 2022-08-05 MED ORDER — AZITHROMYCIN 500 MG PO TABS: 500.0000 mg | ORAL_TABLET | Freq: Every day | ORAL | 0 refills | Status: DC

## 2022-08-05 NOTE — Progress Notes (Signed)
His symptoms of gotten much worse with increasing difficulty with coughing.  I will call in azithromycin

## 2022-08-22 ENCOUNTER — Other Ambulatory Visit: Payer: Self-pay | Admitting: Medical

## 2022-08-28 ENCOUNTER — Ambulatory Visit (INDEPENDENT_AMBULATORY_CARE_PROVIDER_SITE_OTHER): Payer: BC Managed Care – PPO

## 2022-08-28 ENCOUNTER — Ambulatory Visit: Payer: BC Managed Care – PPO | Admitting: Podiatry

## 2022-08-28 DIAGNOSIS — M7742 Metatarsalgia, left foot: Secondary | ICD-10-CM

## 2022-08-28 DIAGNOSIS — R2689 Other abnormalities of gait and mobility: Secondary | ICD-10-CM

## 2022-08-28 DIAGNOSIS — Q828 Other specified congenital malformations of skin: Secondary | ICD-10-CM | POA: Diagnosis not present

## 2022-08-29 NOTE — Progress Notes (Signed)
  Subjective:  Patient ID: Wesley Cruz, male    DOB: [birth year],  MRN: [id number]  Chief Complaint  Patient presents with   Callouses    skin lesion treatment/ discuss next steps procedures not working(possible surgery)    41 y.o. male returns for follow-up with the above complaint. History confirmed with patient.  He returns for follow-up he is still dealing with the same area  Objective:  Physical Exam: warm, good capillary refill, no trophic changes or ulcerative lesions, normal DP and PT pulses and normal sensory exam. Left Foot: Submetatarsal 4 benign skin lesion painful to touch  X-rays of the left foot show adequate metatarsal parabola in the area of the fourth metatarsal and no significant plantarflexion on axial view Assessment:   1. Metatarsalgia, left foot   2. Porokeratosis      Plan:  Patient was evaluated and treated and all questions answered.  We again discussed long-term treatment.  He is interested in having this definitively removed.  I recommended excision of the benign lesion as an excisional biopsy.  Discussed the risk and benefits of this including recurrence and possible painful scar.  We discussed that he will need to be off of the foot for approximately 2 to 3 weeks until his sutures heal completely.  Informed consent was signed and reviewed.  Surgery will be scheduled for an office surgery under local anesthesia in December.  We will dispense his CAM boot at that time.  Today the lesion was debrided with a sharp #312 blade and salinocaine was applied as a courtesy   No follow-ups on file.

## 2022-09-03 DIAGNOSIS — L814 Other melanin hyperpigmentation: Secondary | ICD-10-CM | POA: Diagnosis not present

## 2022-09-03 DIAGNOSIS — D225 Melanocytic nevi of trunk: Secondary | ICD-10-CM | POA: Diagnosis not present

## 2022-09-03 DIAGNOSIS — L728 Other follicular cysts of the skin and subcutaneous tissue: Secondary | ICD-10-CM | POA: Diagnosis not present

## 2022-09-03 DIAGNOSIS — L821 Other seborrheic keratosis: Secondary | ICD-10-CM | POA: Diagnosis not present

## 2022-09-12 ENCOUNTER — Telehealth: Payer: Self-pay | Admitting: Podiatry

## 2022-09-12 NOTE — Telephone Encounter (Addendum)
DOS: 10/11/2022  BCBS Effective 01/18/2020  Excision Benign Lesion 1.0 cm Lt (11421)  DX: L84  Deductible: $1,000 with $979 remaining Out-of-Pocket: $5,000 with $4,346 remaining CoInsurance: 0% Copay: $50  Prior Authorization is not required per Jossie Ng.  Call Reference #: [id number]

## 2022-09-19 DIAGNOSIS — L700 Acne vulgaris: Secondary | ICD-10-CM | POA: Diagnosis not present

## 2022-09-19 DIAGNOSIS — L728 Other follicular cysts of the skin and subcutaneous tissue: Secondary | ICD-10-CM | POA: Diagnosis not present

## 2022-10-02 ENCOUNTER — Ambulatory Visit: Payer: BC Managed Care – PPO | Admitting: Podiatry

## 2022-10-11 ENCOUNTER — Encounter: Payer: Self-pay | Admitting: Podiatry

## 2022-10-11 ENCOUNTER — Ambulatory Visit: Payer: BC Managed Care – PPO | Admitting: Podiatry

## 2022-10-11 ENCOUNTER — Other Ambulatory Visit: Payer: Self-pay

## 2022-10-11 DIAGNOSIS — L988 Other specified disorders of the skin and subcutaneous tissue: Secondary | ICD-10-CM | POA: Diagnosis not present

## 2022-10-11 DIAGNOSIS — D2372 Other benign neoplasm of skin of left lower limb, including hip: Secondary | ICD-10-CM

## 2022-10-11 DIAGNOSIS — L858 Other specified epidermal thickening: Secondary | ICD-10-CM | POA: Diagnosis not present

## 2022-10-11 MED ORDER — KETOROLAC TROMETHAMINE 10 MG PO TABS: 10.0000 mg | ORAL_TABLET | Freq: Four times a day (QID) | ORAL | 0 refills | Status: DC | PRN

## 2022-10-11 MED ORDER — CEPHALEXIN 500 MG PO CAPS: 500.0000 mg | ORAL_CAPSULE | Freq: Three times a day (TID) | ORAL | 0 refills | Status: AC

## 2022-10-11 NOTE — Progress Notes (Signed)
  Patient presents today for excision of chronic painful skin lesion on the left plantar forefoot.  Thus far he has failed all conservative treatments.  Informed consent was signed and reviewed.  All questions were addressed prior to surgery.  The patient was identified in the office and the correct side and site of surgery was noted and confirmed.  A local field block was administered using 2 cc each of 2% lidocaine and 0.5% Marcaine plain.  A semielliptical incision was marked surrounding the lesion which measured 5 mm in diameter.  He was taken to the procedure room and prepped and draped in the usual sterile manner.  A tourniquet was inflated around his ankle at 250 mmHg for 11 minutes.  The lesion was excised with a margin of normal skin in the same ellipse.  It was sent to pathology.  The wound was irrigated and closed with 2-0 and 3-0 nylon sutures.  A surgical shoe was dispensed.  He will remain partial weightbearing to the heel and nonweightbearing with a knee scooter.  I will see him in 1 week for reevaluation.  Rx for cephalexin for 3 days and Toradol as needed was sent to pharmacy  Lanae Crumbly, DPM 10/11/2022

## 2022-10-18 ENCOUNTER — Ambulatory Visit (INDEPENDENT_AMBULATORY_CARE_PROVIDER_SITE_OTHER): Payer: BC Managed Care – PPO | Admitting: Podiatry

## 2022-10-18 DIAGNOSIS — D2372 Other benign neoplasm of skin of left lower limb, including hip: Secondary | ICD-10-CM

## 2022-10-18 NOTE — Progress Notes (Signed)
  Subjective:  Patient ID: Wesley Cruz, male    DOB: Oct 28, 1980,  MRN: [id number]  Chief Complaint  Patient presents with   Routine Post Op    POV #1 DOS 10/11/2022 EXCISION OF BENIGN SKIN LESION LEFT FOOT     41 y.o. male returns for post-op check.  He is doing very well not having much pain  Review of Systems: Negative except as noted in the HPI. Denies N/V/F/Ch.   Objective:  There were no vitals filed for this visit. There is no height or weight on file to calculate BMI. Constitutional Well developed. Well nourished.  Vascular Foot warm and well perfused. Capillary refill normal to all digits.  Calf is soft and supple, no posterior calf or knee pain, negative Homans' sign  Neurologic Normal speech. Oriented to person, place, and time. Epicritic sensation to light touch grossly present bilaterally.  Dermatologic Skin healing well without signs of infection. Skin edges well coapted without signs of infection.  Orthopedic: Tenderness to palpation noted about the surgical site.   Assessment:   1. Benign neoplasm of skin of left foot    Plan:  Patient was evaluated and treated and all questions answered.  S/p foot surgery left -Progressing as expected post-operatively.  Continue limited weightbearing use of knee scooter and weightbearing to heel.  May begin to bathe foot and apply Neosporin and gauze dressing.  This was applied today.  Plan to remove sutures in 2 weeks   Return in about 2 weeks (around 11/01/2022) for suture removal, post op (no x-rays).

## 2022-11-01 ENCOUNTER — Ambulatory Visit (INDEPENDENT_AMBULATORY_CARE_PROVIDER_SITE_OTHER): Payer: BC Managed Care – PPO | Admitting: Podiatry

## 2022-11-01 DIAGNOSIS — D2372 Other benign neoplasm of skin of left lower limb, including hip: Secondary | ICD-10-CM

## 2022-11-01 NOTE — Progress Notes (Signed)
  Subjective:  Patient ID: Wesley Cruz, male    DOB: 1981-03-03,  MRN: [id number]  Follow-up excision of skin lesion left foot 10/11/2022   42 y.o. male returns for post-op check.  He is doing very well not having much pain  Review of Systems: Negative except as noted in the HPI. Denies N/V/F/Ch.   Objective:  There were no vitals filed for this visit. There is no height or weight on file to calculate BMI. Constitutional Well developed. Well nourished.  Vascular Foot warm and well perfused. Capillary refill normal to all digits.  Calf is soft and supple, no posterior calf or knee pain, negative Homans' sign  Neurologic Normal speech. Oriented to person, place, and time. Epicritic sensation to light touch grossly present bilaterally.  Dermatologic Skin healing well without signs of infection. Skin edges well coapted without signs of infection.  Orthopedic: Tenderness to palpation noted about the surgical site.   Assessment:   1. Benign neoplasm of skin of left foot     Plan:  Patient was evaluated and treated and all questions answered.  S/p foot surgery left -Doing very well.  All sutures removed today.  Can fully resume regular bathing shoe gear and activity.  Advised to use silicone scar gel.  I will see him back in 6 weeks for follow-up  No follow-ups on file.

## 2022-12-13 ENCOUNTER — Telehealth: Payer: Self-pay

## 2022-12-13 ENCOUNTER — Ambulatory Visit (INDEPENDENT_AMBULATORY_CARE_PROVIDER_SITE_OTHER): Payer: BC Managed Care – PPO | Admitting: Podiatry

## 2022-12-13 DIAGNOSIS — M25651 Stiffness of right hip, not elsewhere classified: Secondary | ICD-10-CM

## 2022-12-13 DIAGNOSIS — D2372 Other benign neoplasm of skin of left lower limb, including hip: Secondary | ICD-10-CM

## 2022-12-13 DIAGNOSIS — M7742 Metatarsalgia, left foot: Secondary | ICD-10-CM

## 2022-12-13 NOTE — Telephone Encounter (Signed)
PT order and demographics faxed to [facility] Ph [phone]

## 2022-12-13 NOTE — Progress Notes (Signed)
  Subjective:  Patient ID: Wesley Cruz, male    DOB: [birth year],  MRN: [id number]  Follow-up excision of skin lesion left foot 10/11/2022   42 y.o. male returns for post-op check.  He feels good he is not having pain he has not used the scar cream.  His hip feels stiff and difficult to move is much in the right second toe may be from wearing the shoe and being off the left side so much after surgery  Review of Systems: Negative except as noted in the HPI. Denies N/V/F/Ch.   Objective:  There were no vitals filed for this visit. There is no height or weight on file to calculate BMI. Constitutional Well developed. Well nourished.  Vascular Foot warm and well perfused. Capillary refill normal to all digits.  Calf is soft and supple, no posterior calf or knee pain, negative Homans' sign  Neurologic Normal speech. Oriented to person, place, and time. Epicritic sensation to light touch grossly present bilaterally.  Dermatologic Incision is well-healed not hypertrophic  Orthopedic: Tenderness to palpation noted about the surgical site.   Assessment:   1. Hip joint stiffness, right   2. Benign neoplasm of skin of left foot   3. Metatarsalgia, left foot     Plan:  Patient was evaluated and treated and all questions answered.  S/p foot surgery left -Continues to do quite well, his foot is pain-free, he does have some callus scar tissue around the incision, this was debrided today.  Advised him to continue using urea cream or silicone gel or both for a few more weeks.  I do think he would benefit from some physical therapy, golf training and sport specific training for golf.  Referral was sent to [facility] physical therapy.  He sees his golf trainer today and tomorrow and will let me know if there is a specific therapist for golf PT he recommends and will let me know  Return if symptoms worsen or fail to improve.

## 2022-12-13 NOTE — Patient Instructions (Addendum)
[facility]: Phone: [phone] [place]

## 2022-12-14 ENCOUNTER — Telehealth: Payer: Self-pay | Admitting: *Deleted

## 2022-12-14 NOTE — Telephone Encounter (Signed)
Patient is calling for status of PT referral to [facility], [address],explained that the physician's nurse had sent the referral on the 22nd. Verbalized understanding and has an upcoming appointment on Monday'@noon'$ .

## 2022-12-17 DIAGNOSIS — M25551 Pain in right hip: Secondary | ICD-10-CM | POA: Diagnosis not present

## 2022-12-17 DIAGNOSIS — M545 Low back pain, unspecified: Secondary | ICD-10-CM | POA: Diagnosis not present

## 2022-12-25 DIAGNOSIS — M25551 Pain in right hip: Secondary | ICD-10-CM | POA: Diagnosis not present

## 2022-12-25 DIAGNOSIS — M545 Low back pain, unspecified: Secondary | ICD-10-CM | POA: Diagnosis not present

## 2022-12-27 DIAGNOSIS — M25551 Pain in right hip: Secondary | ICD-10-CM | POA: Diagnosis not present

## 2022-12-27 DIAGNOSIS — M545 Low back pain, unspecified: Secondary | ICD-10-CM | POA: Diagnosis not present

## 2023-01-31 DIAGNOSIS — L400 Psoriasis vulgaris: Secondary | ICD-10-CM | POA: Diagnosis not present

## 2023-01-31 DIAGNOSIS — L0292 Furuncle, unspecified: Secondary | ICD-10-CM | POA: Diagnosis not present

## 2023-03-12 ENCOUNTER — Ambulatory Visit: Payer: BC Managed Care – PPO | Admitting: Podiatry

## 2023-03-12 DIAGNOSIS — M7742 Metatarsalgia, left foot: Secondary | ICD-10-CM

## 2023-03-12 DIAGNOSIS — M7741 Metatarsalgia, right foot: Secondary | ICD-10-CM | POA: Diagnosis not present

## 2023-03-12 DIAGNOSIS — D2372 Other benign neoplasm of skin of left lower limb, including hip: Secondary | ICD-10-CM | POA: Diagnosis not present

## 2023-03-12 NOTE — Progress Notes (Signed)
  Subjective:  Patient ID: Wesley Cruz, male    DOB: [birth year],  MRN: [id number]  Chief Complaint  Patient presents with   Callouses    left foot growth is coming back, becoming painful     42 y.o. male presents with the above complaint. History confirmed with patient.  Unfortunate has had recurrence of the lesion in the same spot as before now is another lesion adjacent to it.  He is interested in orthotics to see if this help treat it.  Objective:  Physical Exam: warm, good capillary refill, no trophic changes or ulcerative lesions, normal DP and PT pulses, normal sensory exam, and he has gastrocnemius equinus bilaterally, left foot diffuse callus submetatarsal 1, pinpoint porokeratosis 2 separate lesions 1 in the previous scar which is not hypertrophic and 1 adjacent to immediately submetatarsal 3/4 area Assessment:   1. Benign neoplasm of skin of left foot   2. Metatarsalgia of both feet      Plan:  Patient was evaluated and treated and all questions answered.  Unfortunately has had recurrence of the lesion.  Lesion was debrided today sharply with a #312 blade.  I do think custom molded orthoses would be beneficial to see if this can alleviate this further for him.  He would like to avoid further surgery at this point.  Long-term metatarsal osteotomy may offer some improvement but would like to see if antibiotics are beneficial for this.  He was scanned for custom molded orthoses today.  We will plan to support this with a forefoot PPT padding, unload submetatarsal 1 and 4 on the marked lesion as well as a small metatarsal pad.  Will let him know when he is ready and he will let me know if there need further adjustments as well as if he lesions need further debridement in the future.  No follow-ups on file.

## 2023-05-14 ENCOUNTER — Ambulatory Visit: Payer: BC Managed Care – PPO

## 2023-05-14 NOTE — Progress Notes (Addendum)
Patient presents today to pick up custom molded foot orthotics, diagnosed with Metatarsalgia BIL by Dr. Lilian Kapur.   Orthotics were dispensed and fit was satisfactory. Reviewed instructions for break-in and wear. Written instructions given to patient.  Patient will follow up as needed.   Addison Bailey Cped, CFo, CFm     07/03/23 per Dr Request to please re-order Orthotics with extra padding in FF as well make MT pads from small to medium   Addison Bailey CPed, CFo, CFm

## 2023-06-06 ENCOUNTER — Other Ambulatory Visit: Payer: Self-pay | Admitting: Medical

## 2023-06-06 NOTE — Telephone Encounter (Signed)
Has upcoming appt end of August

## 2023-06-13 ENCOUNTER — Other Ambulatory Visit: Payer: Self-pay | Admitting: Medical

## 2023-06-14 ENCOUNTER — Ambulatory Visit: Payer: BC Managed Care – PPO | Admitting: Medical

## 2023-06-14 VITALS — BP 120/70 | HR 68 | Temp 97.5°F | Wt 245.6 lb

## 2023-06-14 DIAGNOSIS — J0100 Acute maxillary sinusitis, unspecified: Secondary | ICD-10-CM | POA: Diagnosis not present

## 2023-06-14 MED ORDER — AMOXICILLIN 875 MG PO TABS: 875.0000 mg | ORAL_TABLET | Freq: Two times a day (BID) | ORAL | 0 refills | Status: AC

## 2023-06-14 NOTE — Progress Notes (Signed)
Subjective:  Wesley Cruz is a 42 y.o. male who presents for Chief Complaint  Patient presents with   URI    Sinus pain taking mucinex D- yellow discharge with blood in nose, sinus pressure, no fever- cough, negative for covid today.      Here for a week hx/o sinus pressure, sinus pain, dark yellow discharge, but no fever.  Has used some mucinex D for 5 days BID.  No sore throat, no body aches, no chills.  Occasional cough clearing mucous.  No NVD.  No SOB or wheezing.   Had 2 recent covid tests days apart that were negative.  No other aggravating or relieving factors.   No other c/o.   Past Medical History:  Diagnosis Date   Acne    Anal skin tag    Elevated LFTs    2017 - 2021 ; abnormal [place], negative hepatitis infection 2017 labs   History of alcohol abuse    16 years of drinking; went through residential treatment program 2018; sober 5 years as of 05/2022   Hyperlipidemia 03/2014   Hypertension 03/2014   Seizure (HCC) 2009   questionable seizure, syncope, one episode, none since - seen at [facility] ED    Current Outpatient Medications on File Prior to Visit  Medication Sig Dispense Refill   Cholecalciferol (VITAMIN D) 50 MCG (2000 UT) CAPS Take 1 capsule (2,000 Units total) by mouth daily. 90 capsule 3   hydrochlorothiazide (HYDRODIURIL) 25 MG tablet TAKE 1 TABLET(25 MG) BY MOUTH DAILY 90 tablet 2   omega-3 acid ethyl esters (LOVAZA) 1 g capsule TAKE 2 CAPSULES BY MOUTH TWICE A DAY 360 capsule 0   pimecrolimus (ELIDEL) 1 % cream Apply topically.     rosuvastatin (CRESTOR) 40 MG tablet TAKE 1 TABLET BY MOUTH DAILY 30 tablet 0   triamcinolone cream (KENALOG) 0.1 % Apply topically.     Multiple Vitamins-Minerals (EMERGEN-C IMMUNE PLUS) PACK Take 1 tablet by mouth 2 (two) times daily. (Patient not taking: Reported on 06/14/2023) 10 each 0   No current facility-administered medications on file prior to visit.     The following portions of the patient's history were reviewed and updated  as appropriate: allergies, current medications, past family history, past medical history, past social history, past surgical history and problem list.  ROS Otherwise as in subjective above    Objective: BP 120/70   Pulse 68   Temp (!) 97.5 F (36.4 C)   Wt 245 lb 9.6 oz (111.4 kg)   BMI 30.70 kg/m   General appearance: alert, no distress, well developed, well nourished HEENT: normocephalic, sclerae anicteric, conjunctiva pink and moist, TMs flat, nares patent, no discharge or erythema, pharynx with mild erythema Oral cavity: MMM, no lesions Neck: supple, no lymphadenopathy, no thyromegaly, no masses Heart: RRR, normal S1, S2, no murmurs Lungs: CTA bilaterally, no wheezes, rhonchi, or rales    Assessment: Encounter Diagnosis  Name Primary?   Acute non-recurrent maxillary sinusitis Yes      Plan: Advised rest, hydration, continue a few more days of mucinex D, begin antibiotic.  If not much improved in 3-4 days, let us know  Kushagra was seen today for uri.  Diagnoses and all orders for this visit:  Acute non-recurrent maxillary sinusitis  Other orders -     amoxicillin (AMOXIL) 875 MG tablet; Take 1 tablet (875 mg total) by mouth 2 (two) times daily for 10 days.   Follow up: prn

## 2023-06-18 ENCOUNTER — Encounter: Payer: Self-pay | Admitting: Medical

## 2023-06-18 ENCOUNTER — Ambulatory Visit: Payer: BC Managed Care – PPO | Admitting: Medical

## 2023-06-18 VITALS — BP 106/66 | HR 69 | Ht 75.0 in | Wt 249.2 lb

## 2023-06-18 DIAGNOSIS — F17200 Nicotine dependence, unspecified, uncomplicated: Secondary | ICD-10-CM | POA: Diagnosis not present

## 2023-06-18 DIAGNOSIS — Z7185 Encounter for immunization safety counseling: Secondary | ICD-10-CM | POA: Diagnosis not present

## 2023-06-18 DIAGNOSIS — E785 Hyperlipidemia, unspecified: Secondary | ICD-10-CM

## 2023-06-18 DIAGNOSIS — Z23 Encounter for immunization: Secondary | ICD-10-CM

## 2023-06-18 DIAGNOSIS — F1021 Alcohol dependence, in remission: Secondary | ICD-10-CM

## 2023-06-18 DIAGNOSIS — E559 Vitamin D deficiency, unspecified: Secondary | ICD-10-CM | POA: Diagnosis not present

## 2023-06-18 DIAGNOSIS — Z0000 Encounter for general adult medical examination without abnormal findings: Secondary | ICD-10-CM

## 2023-06-18 DIAGNOSIS — L409 Psoriasis, unspecified: Secondary | ICD-10-CM

## 2023-06-18 LAB — COMPREHENSIVE METABOLIC PANEL
ALT: 26 IU/L (ref 0–44)
AST: 20 IU/L (ref 0–40)
Albumin: 4.8 g/dL (ref 4.1–5.1)
Alkaline Phosphatase: 63 IU/L (ref 44–121)
BUN/Creatinine Ratio: 15 (ref 9–20)
BUN: 13 mg/dL (ref 6–24)
Bilirubin Total: 0.8 mg/dL (ref 0.0–1.2)
CO2: 25 mmol/L (ref 20–29)
Calcium: 9.7 mg/dL (ref 8.7–10.2)
Chloride: 101 mmol/L (ref 96–106)
Creatinine, Ser: 0.84 mg/dL (ref 0.76–1.27)
Globulin, Total: 1.9 g/dL (ref 1.5–4.5)
Glucose: 97 mg/dL (ref 70–99)
Potassium: 4.1 mmol/L (ref 3.5–5.2)
Sodium: 139 mmol/L (ref 134–144)
Total Protein: 6.7 g/dL (ref 6.0–8.5)
eGFR: 112 mL/min/{1.73_m2} (ref >59)

## 2023-06-18 LAB — CBC
Hematocrit: 48 % (ref 37.5–51.0)
Hemoglobin: 16 g/dL (ref 13.0–17.7)
MCH: 31.3 pg (ref 26.6–33.0)
MCHC: 33.3 g/dL (ref 31.5–35.7)
MCV: 94 fL (ref 79–97)
Platelets: 234 10*3/uL (ref 150–450)
RBC: 5.11 x10E6/uL (ref 4.14–5.80)
RDW: 12.6 % (ref 11.6–15.4)
WBC: 7.3 10*3/uL (ref 3.4–10.8)

## 2023-06-18 LAB — LIPID PANEL
Chol/HDL Ratio: 2.6 ratio (ref 0.0–5.0)
Cholesterol, Total: 102 mg/dL (ref 100–199)
HDL: 40 mg/dL (ref >39)
LDL Chol Calc (NIH): 49 mg/dL (ref 0–99)
Triglycerides: 56 mg/dL (ref 0–149)
VLDL Cholesterol Cal: 13 mg/dL (ref 5–40)

## 2023-06-18 LAB — POCT URINALYSIS DIP (PROADVANTAGE DEVICE)
Bilirubin, UA: NEGATIVE
Blood, UA: NEGATIVE
Glucose, UA: NEGATIVE mg/dL
Ketones, POC UA: NEGATIVE mg/dL
Leukocytes, UA: NEGATIVE
Nitrite, UA: NEGATIVE
Protein Ur, POC: NEGATIVE mg/dL
Specific Gravity, Urine: 1.01
Urobilinogen, Ur: NEGATIVE
pH, UA: 6 (ref 5.0–8.0)

## 2023-06-18 LAB — TSH: TSH: 1.76 u[IU]/mL (ref 0.450–4.500)

## 2023-06-18 NOTE — Progress Notes (Signed)
Subjective:   HPI  Wesley Cruz is a 42 y.o. male who presents for Chief Complaint  Patient presents with   Annual Exam    Fasting cpe, no concerns,     Patient Care Team: Taylar Hartsough, Kermit Balo, PA-C as PCP - General (Family Medicine) Lilian Kapur Rachelle Hora, DPM as Consulting Physician (Podiatry) Sees dentist Sees eye doctor Dr. Sharl Ma, podiatry Dr. Romie Levee, general surgery Dr.Henry Danis, and prior colonoscopy with Dr. Rosemarie Beath dermatology, Dr Wallace Cullens  Concerns: 6 years sober in May 2023.  Compliant with BP and cholesterol medications, fish oil and statin.  Has some eczema behind ears and around eye orbits.  Uses this intermittent.  Has some elbow psoriasis rash  mild.  Has some discomfort a while back right elbow.    Reviewed their medical, surgical, family, social, medication, and allergy history and updated chart as appropriate.  Past Medical History:  Diagnosis Date   Acne    Anal skin tag    Elevated LFTs    2017 - 2021 ; abnormal [place], negative hepatitis infection 2017 labs   History of alcohol abuse    16 years of drinking; went through residential treatment program 2018; sober 5 years as of 05/2022   Hyperlipidemia 03/2014   Hypertension 03/2014   Seizure (HCC) 2009   questionable seizure, syncope, one episode, none since - seen at [facility] ED     Past Surgical History:  Procedure Laterality Date   COLONOSCOPY  2017   Dr. Loreta Ave   EXCISION OF SKIN TAG N/A 09/01/2020   Procedure: EXCISION OF SKIN TAG;  Surgeon: Romie Levee, MD;  Location: [facility];  Service: General;  Laterality: N/A;   FISTULOTOMY N/A 09/01/2020   Procedure: FISTULOTOMY;  Surgeon: Romie Levee, MD;  Location: [facility];  Service: General;  Laterality: N/A;   LACERATION REPAIR  2009   right dorsal hand; s/p punching wall   RECTAL EXAM UNDER ANESTHESIA N/A 09/01/2020   Procedure: anal EXAM UNDER ANESTHESIA;  Surgeon: Romie Levee, MD;   Location: [facility];  Service: General;  Laterality: N/A;    Family History  Problem Relation Age of Onset   Hypertension Father    Hypertension Paternal Uncle    Stroke Paternal Uncle 98   Stroke Paternal Uncle 40   Heart disease Paternal Uncle        CABG   Heart disease Paternal Uncle        CAD, stents   Cancer Paternal Grandmother        lung?   COPD Paternal Grandmother    COPD Paternal Grandfather    Diabetes Neg Hx      Current Outpatient Medications:    amoxicillin (AMOXIL) 875 MG tablet, Take 1 tablet (875 mg total) by mouth 2 (two) times daily for 10 days., Disp: 20 tablet, Rfl: 0   Cholecalciferol (VITAMIN D) 50 MCG (2000 UT) CAPS, Take 1 capsule (2,000 Units total) by mouth daily., Disp: 90 capsule, Rfl: 3   hydrochlorothiazide (HYDRODIURIL) 25 MG tablet, TAKE 1 TABLET(25 MG) BY MOUTH DAILY, Disp: 90 tablet, Rfl: 2   Multiple Vitamins-Minerals (EMERGEN-C IMMUNE PLUS) PACK, Take 1 tablet by mouth 2 (two) times daily., Disp: 10 each, Rfl: 0   omega-3 acid ethyl esters (LOVAZA) 1 g capsule, TAKE 2 CAPSULES BY MOUTH TWICE A DAY, Disp: 360 capsule, Rfl: 0   pimecrolimus (ELIDEL) 1 % cream, Apply topically., Disp: , Rfl:    rosuvastatin (CRESTOR) 40 MG  tablet, TAKE 1 TABLET BY MOUTH DAILY, Disp: 30 tablet, Rfl: 0   triamcinolone cream (KENALOG) 0.1 %, Apply topically., Disp: , Rfl:   Allergies  Allergen Reactions   Hornet Venom Anaphylaxis    Japanese hornet   Lisinopril     cough    Review of Systems  Constitutional:  Negative for chills, fever, malaise/fatigue and weight loss.  HENT:  Positive for sinus pain. Negative for congestion, ear pain, hearing loss, sore throat and tinnitus.   Eyes:  Negative for blurred vision, pain and redness.  Respiratory:  Negative for cough, hemoptysis and shortness of breath.   Cardiovascular:  Negative for chest pain, palpitations, orthopnea, claudication and leg swelling.  Gastrointestinal:  Negative for  abdominal pain, blood in stool, constipation, diarrhea, nausea and vomiting.  Genitourinary:  Negative for dysuria, flank pain, frequency, hematuria and urgency.  Musculoskeletal:  Positive for joint pain. Negative for falls and myalgias.  Skin:  Positive for rash. Negative for itching.  Neurological:  Negative for dizziness, tingling, speech change, weakness and headaches.  Endo/Heme/Allergies:  Negative for polydipsia. Does not bruise/bleed easily.  Psychiatric/Behavioral:  Negative for depression and memory loss. The patient is not nervous/anxious and does not have insomnia.         06/18/2023    8:16 AM 06/12/2022    9:14 AM 12/01/2021    9:35 AM 05/30/2021    9:07 AM 05/09/2020    8:42 AM  Depression screen PHQ 2/9  Decreased Interest 0 0 0 0 0  Down, Depressed, Hopeless 0 0 0 0 0  PHQ - 2 Score 0 0 0 0 0        Objective:  BP 106/66   Pulse 69   Ht 6\' 3"  (1.905 m)   Wt 249 lb 3.2 oz (113 kg)   BMI 31.15 kg/m   Wt Readings from Last 3 Encounters:  06/18/23 249 lb 3.2 oz (113 kg)  06/14/23 245 lb 9.6 oz (111.4 kg)  07/30/22 242 lb (109.8 kg)   BP Readings from Last 3 Encounters:  06/18/23 106/66  06/14/23 120/70  06/12/22 120/80    General appearance: alert, no distress, WD/WN, Caucasian male Skin: mild rough patches of skin on both elbows, otherwise skin unremarkable HEENT: normocephalic, conjunctiva/corneas normal, sclerae anicteric, PERRLA, EOMi, nares patent, no discharge or erythema, pharynx normal Neck: supple, no lymphadenopathy, no thyromegaly, no masses, normal ROM, no bruits Chest: non tender, normal shape and expansion Heart: RRR, normal S1, S2, no murmurs Lungs: CTA bilaterally, no wheezes, rhonchi, or rales Abdomen: +bs, soft, non tender, non distended, no masses, no hepatomegaly, no splenomegaly, no bruits Back: somewhat stiff and reduced ROM due to low back pain, otherwise non tender, no scoliosis Musculoskeletal:  upper extremities non tender, no  obvious deformity, normal ROM throughout, lower extremities non tender, no obvious deformity, normal ROM throughout Extremities: no edema, no cyanosis, no clubbing Pulses: 2+ symmetric, upper and lower extremities, normal cap refill Neurological: alert, oriented x 3, CN2-12 intact, strength normal upper extremities and lower extremities, sensation normal throughout, DTRs 2+ throughout, no cerebellar signs, gait normal Psychiatric: normal affect, behavior normal, pleasant  GU: normal male external genitalia,circumcised, nontender, no masses, no hernia, no lymphadenopathy Rectal: deferred   Assessment and Plan :   Encounter Diagnoses  Name Primary?   Encounter for health maintenance examination in adult Yes   Vitamin D deficiency    Vaccine counseling    Smoker    Recovering alcoholic in remission (HCC)  Psoriasis    Hyperlipidemia, unspecified hyperlipidemia type    Need for influenza vaccination     This visit was a preventative care visit, also known as wellness visit or routine physical.   Topics typically include healthy lifestyle, diet, exercise, preventative care, vaccinations, sick and well care, proper use of emergency dept and after hours care, as well as other concerns.     Recommendations: Continue to return yearly for your annual wellness and preventative care visits.  This gives [place] a chance to discuss healthy lifestyle, exercise, vaccinations, review your chart record, and perform screenings where appropriate.  I recommend you see your eye doctor yearly for routine vision care.  I recommend you see your dentist yearly for routine dental care including hygiene visits twice yearly.   Vaccination recommendations were reviewed Immunization History  Administered Date(s) Administered   Influenza,inj,Quad PF,6+ Mos 08/07/2019, 06/12/2022   Influenza-Unspecified 08/22/2018, 06/22/2021   PFIZER(Purple Top)SARS-COV-2 Vaccination 12/26/2019, 01/26/2020   Pneumococcal  Polysaccharide-23 03/24/2019   Tdap 12/20/2014   Counseled on the influenza virus vaccine.  Vaccine information sheet given.  Influenza vaccine given after consent obtained.    Screening for cancer: Colon cancer screening: Age 35  We discussed PSA, prostate exam, and prostate cancer screening risks/benefits.   Age 43  Skin cancer screening: Check your skin regularly for new changes, growing lesions, or other lesions of concern Come in for evaluation if you have skin lesions of concern.  Lung cancer screening: If you have a greater than 20 pack year history of tobacco use, then you may qualify for lung cancer screening with a chest CT scan.   Please call your insurance company to inquire about coverage for this test.  We currently don't have screenings for other cancers besides breast, cervical, colon, and lung cancers.  If you have a strong family history of cancer or have other cancer screening concerns, please let me know.    Bone health: Get at least 150 minutes of aerobic exercise weekly Get weight bearing exercise at least once weekly Bone density test:  A bone density test is an imaging test that uses a type of X-ray to measure the amount of calcium and other minerals in your bones. The test may be used to diagnose or screen you for a condition that causes weak or thin bones (osteoporosis), predict your risk for a broken bone (fracture), or determine how well your osteoporosis treatment is working. The bone density test is recommended for females 65 and older, or females or males <65 if certain risk factors such as thyroid disease, long term use of steroids such as for asthma or rheumatological issues, vitamin D deficiency, estrogen deficiency, family history of osteoporosis, self or family history of fragility fracture in first degree relative.    Heart health: Get at least 150 minutes of aerobic exercise weekly Limit alcohol It is important to maintain a healthy blood  pressure and healthy cholesterol numbers  Heart disease screening: Screening for heart disease includes screening for blood pressure, fasting lipids, glucose/diabetes screening, BMI height to weight ratio, reviewed of smoking status, physical activity, and diet.    Goals include blood pressure 120/80 or less, maintaining a healthy lipid/cholesterol profile, preventing diabetes or keeping diabetes numbers under good control, not smoking or using tobacco products, exercising most days per week or at least 150 minutes per week of exercise, and eating healthy variety of fruits and vegetables, healthy oils, and avoiding unhealthy food choices like fried food, fast food, high sugar and  high cholesterol foods.    Other tests may possibly include EKG test, CT coronary calcium score, echocardiogram, exercise treadmill stress test.     Medical care options: I recommend you continue to seek care here first for routine care.  We try really hard to have available appointments Monday through Friday daytime hours for sick visits, acute visits, and physicals.  Urgent care should be used for after hours and weekends for significant issues that cannot wait till the next day.  The emergency department should be used for significant potentially life-threatening emergencies.  The emergency department is expensive, can often have long wait times for less significant concerns, so try to utilize primary care, urgent care, or telemedicine when possible to avoid unnecessary trips to the emergency department.  Virtual visits and telemedicine have been introduced since the pandemic started in 2020, and can be convenient ways to receive medical care.  We offer virtual appointments as well to assist you in a variety of options to seek medical care.   Separate significant issues discussed:  HTN -continue current medication HCTZ 25mg  daily, routine labs today  Hyperlipidemia and fatty liver disease-continue current medications  Crestor and Lovaza  Vitamin D deficiency-he is currently using vitamin D 2000 units daily.  Labs today  Tobacco use - advised cessation.    Psoriasis - uses Elidel periodically  Hx/o fatty liver -consider repeat US abdomen in next 2 years.   Mate was seen today for annual exam.  Diagnoses and all orders for this visit:  Encounter for health maintenance examination in adult -     Comprehensive metabolic panel -     CBC -     Lipid panel -     POCT Urinalysis DIP (Proadvantage Device) -     TSH  Vitamin D deficiency  Vaccine counseling  Smoker  Recovering alcoholic in remission (HCC)  Psoriasis  Hyperlipidemia, unspecified hyperlipidemia type -     Lipid panel  Need for influenza vaccination    Follow-up pending labs, yearly for physical

## 2023-06-19 ENCOUNTER — Other Ambulatory Visit: Payer: Self-pay | Admitting: Medical

## 2023-06-19 MED ORDER — HYDROCHLOROTHIAZIDE 25 MG PO TABS: 25.0000 mg | ORAL_TABLET | Freq: Every day | ORAL | 2 refills | Status: DC

## 2023-06-19 MED ORDER — ROSUVASTATIN CALCIUM 40 MG PO TABS: ORAL_TABLET | ORAL | 2 refills | Status: DC

## 2023-06-19 MED ORDER — VITAMIN D 50 MCG (2000 UT) PO CAPS: 1.0000 | ORAL_CAPSULE | Freq: Every day | ORAL | 2 refills | Status: DC

## 2023-06-19 MED ORDER — OMEGA-3-ACID ETHYL ESTERS 1 G PO CAPS: 2.0000 | ORAL_CAPSULE | Freq: Two times a day (BID) | ORAL | 2 refills | Status: DC

## 2023-06-19 NOTE — Progress Notes (Signed)
Results sent through MyChart

## 2023-07-02 ENCOUNTER — Ambulatory Visit: Payer: BC Managed Care – PPO | Admitting: Podiatry

## 2023-07-02 ENCOUNTER — Encounter: Payer: Self-pay | Admitting: Podiatry

## 2023-07-02 DIAGNOSIS — D2372 Other benign neoplasm of skin of left lower limb, including hip: Secondary | ICD-10-CM

## 2023-07-02 DIAGNOSIS — M7742 Metatarsalgia, left foot: Secondary | ICD-10-CM

## 2023-07-02 NOTE — Progress Notes (Signed)
  Subjective:  Patient ID: Wesley Cruz, male    DOB: 07-06-1981,  MRN: [id number]  Chief Complaint  Patient presents with   Callouses    Patient is here for corn on left of foot continues to come back. Also brought insoles to show the wear.     42 y.o. male presents with the above complaint. History confirmed with patient.  He has had his orthotics for about 6 weeks now, been very comfortable in helped quite a bit.  He is interested in having a second pair fashioned as well.  Feels like he could use some increased cushioning.  The lesion has returned again and is growing and causing pain discomfort again.  Objective:  Physical Exam: warm, good capillary refill, no trophic changes or ulcerative lesions, normal DP and PT pulses, normal sensory exam, and he has gastrocnemius equinus bilaterally, left foot diffuse callus submetatarsal 1, pinpoint porokeratosis 2 separate lesions 1 in the previous scar which is not hypertrophic and 1 adjacent to immediately submetatarsal 3/4 area Assessment:   1. Benign neoplasm of skin of left foot   2. Metatarsalgia, left foot       Plan:  Patient was evaluated and treated and all questions answered.  Still has chronic recurrence of the lesion.  Lesion was debrided today sharply with a #312 blade.  Salicylic acid was applied to destroy the lesion.  I inspected his current orthotics they are well-positioned and well fashion it could use an increased PPT in the forefoot and an increase in the size of the metatarsal pad.  He would like to have a second pair made as well, we will place an order for this with increase in the cushioning and metatarsal pad, once these are in we will send his old pair back for an adjustment.  I will see him back in 4 months for regular care of the lesion.  Return in about 4 months (around 11/01/2023) for Left foot corn.

## 2023-08-20 ENCOUNTER — Ambulatory Visit: Payer: BC Managed Care – PPO

## 2023-08-20 DIAGNOSIS — M79673 Pain in unspecified foot: Secondary | ICD-10-CM

## 2023-08-27 DIAGNOSIS — L0292 Furuncle, unspecified: Secondary | ICD-10-CM | POA: Diagnosis not present

## 2023-08-27 DIAGNOSIS — L700 Acne vulgaris: Secondary | ICD-10-CM | POA: Diagnosis not present

## 2023-08-27 DIAGNOSIS — L400 Psoriasis vulgaris: Secondary | ICD-10-CM | POA: Diagnosis not present

## 2023-09-02 NOTE — Progress Notes (Signed)
Patient presents today to pick up custom molded foot orthotics, diagnosed with benign neoplasm left foot / skin by Dr. Lilian Kapur .   Orthotics were dispensed and fit was satisfactory. Reviewed instructions for break-in and wear. Written instructions given to patient.  Patient will follow up as needed.  Wesley Cruz CPed, CFo, CFm 1/2 half off $245

## 2023-09-10 ENCOUNTER — Ambulatory Visit: Payer: BC Managed Care – PPO | Admitting: Podiatry

## 2023-09-10 ENCOUNTER — Encounter: Payer: Self-pay | Admitting: Podiatry

## 2023-09-10 VITALS — Ht 75.0 in | Wt 250.0 lb

## 2023-09-10 DIAGNOSIS — D2372 Other benign neoplasm of skin of left lower limb, including hip: Secondary | ICD-10-CM

## 2023-09-12 NOTE — Progress Notes (Signed)
  Subjective:  Patient ID: Wesley Cruz, male    DOB: 1981/07/18,  MRN: [id number]  Chief Complaint  Patient presents with   Callouses    Patient is here for callous on left foot    42 y.o. male presents with the above complaint. History confirmed with patient.  He returns for follow-up he received his second pair of orthotics and had adjustment and these are doing well.  The lesion has returned gradually over time and is bothersome again.  Objective:  Physical Exam: warm, good capillary refill, no trophic changes or ulcerative lesions, normal DP and PT pulses, normal sensory exam, and he has gastrocnemius equinus bilaterally, left foot diffuse callus submetatarsal 1, today single solitary lesion submetatarsal 3 that is painful and benign appearing  Assessment:   1. Benign neoplasm of skin of left foot       Plan:  Patient was evaluated and treated and all questions answered.  Still has chronic recurrence of the lesion although much improved with the use of his orthoses and regular care.  I recommend we continue this treatment plan..  Lesion was debrided today sharply with a #312 blade.  Salicylic acid was applied to destroy the lesion.  Return in 3 months for follow-up and further treatment.  Return in about 3 months (around 12/11/2023).

## 2023-09-18 ENCOUNTER — Other Ambulatory Visit: Payer: Self-pay | Admitting: Medical

## 2023-10-04 DIAGNOSIS — M25562 Pain in left knee: Secondary | ICD-10-CM | POA: Diagnosis not present

## 2023-10-29 DIAGNOSIS — M25562 Pain in left knee: Secondary | ICD-10-CM | POA: Diagnosis not present

## 2023-11-15 DIAGNOSIS — M25562 Pain in left knee: Secondary | ICD-10-CM | POA: Diagnosis not present

## 2023-12-03 DIAGNOSIS — E782 Mixed hyperlipidemia: Secondary | ICD-10-CM | POA: Diagnosis not present

## 2023-12-03 DIAGNOSIS — Z1211 Encounter for screening for malignant neoplasm of colon: Secondary | ICD-10-CM | POA: Diagnosis not present

## 2023-12-03 DIAGNOSIS — I10 Essential (primary) hypertension: Secondary | ICD-10-CM | POA: Diagnosis not present

## 2023-12-03 DIAGNOSIS — Z860100 Personal history of colon polyps, unspecified: Secondary | ICD-10-CM | POA: Diagnosis not present

## 2023-12-10 ENCOUNTER — Encounter: Payer: Self-pay | Admitting: Podiatry

## 2023-12-10 ENCOUNTER — Ambulatory Visit: Payer: BC Managed Care – PPO | Admitting: Podiatry

## 2023-12-10 DIAGNOSIS — D2372 Other benign neoplasm of skin of left lower limb, including hip: Secondary | ICD-10-CM

## 2023-12-10 DIAGNOSIS — M7742 Metatarsalgia, left foot: Secondary | ICD-10-CM

## 2023-12-10 DIAGNOSIS — M7741 Metatarsalgia, right foot: Secondary | ICD-10-CM

## 2023-12-11 NOTE — Progress Notes (Signed)
Inserts sent to [employer] for modification will charge for refurbish $90  Please collect when patient comes to [facility]

## 2023-12-11 NOTE — Progress Notes (Signed)
  Subjective:  Patient ID: Wesley Cruz, male    DOB: [birth year],  MRN: [id number]  Chief Complaint  Patient presents with   Callouses    Patient is here to get his Callouse cut on his left foot, and also would like to discuss about his inserts that are to thick     43 y.o. male presents with the above complaint. History confirmed with patient.  He returns for follow-up, the lesion has returned again and is painful.  He has been trying to wear his new pair of orthotics, he feels that the padding is too thick in the forefoot and is causing difficulty fitting in shoes  Objective:  Physical Exam: warm, good capillary refill, no trophic changes or ulcerative lesions, normal DP and PT pulses, normal sensory exam, and he has gastrocnemius equinus bilaterally, left foot diffuse callus submetatarsal 1, today single solitary lesion submetatarsal 3 that is painful and benign appearing  Assessment:   1. Metatarsalgia of both feet   2. Benign neoplasm of skin of left foot       Plan:  Patient was evaluated and treated and all questions answered.  Lesion was debrided today and the core was enucleated with a sharp #312 scalpel blade.  Salicylic acid was applied to destroy the lesion.  A bandage was applied and he will leave this on for 24 hours.  He will schedule follow-up in 3 to 4 months for retreatment as needed.  We will send his orthotics back for adjustment for removal of 1 layer of the PPT which is too thick for him   No follow-ups on file.

## 2024-01-09 ENCOUNTER — Telehealth: Payer: Self-pay

## 2024-01-09 DIAGNOSIS — M79673 Pain in unspecified foot: Secondary | ICD-10-CM

## 2024-01-09 NOTE — Telephone Encounter (Signed)
 Left vm to let pt know that his refurbs have came in and their is a $90 charge

## 2024-01-17 DIAGNOSIS — K635 Polyp of colon: Secondary | ICD-10-CM | POA: Diagnosis not present

## 2024-01-17 DIAGNOSIS — K562 Volvulus: Secondary | ICD-10-CM | POA: Diagnosis not present

## 2024-01-17 DIAGNOSIS — Z1211 Encounter for screening for malignant neoplasm of colon: Secondary | ICD-10-CM | POA: Diagnosis not present

## 2024-01-17 DIAGNOSIS — Z860101 Personal history of adenomatous and serrated colon polyps: Secondary | ICD-10-CM | POA: Diagnosis not present

## 2024-01-17 DIAGNOSIS — K648 Other hemorrhoids: Secondary | ICD-10-CM | POA: Diagnosis not present

## 2024-01-17 DIAGNOSIS — Z860102 Personal history of hyperplastic colon polyps: Secondary | ICD-10-CM | POA: Diagnosis not present

## 2024-01-17 LAB — HM COLONOSCOPY

## 2024-01-21 ENCOUNTER — Encounter: Payer: Self-pay | Admitting: Medical

## 2024-02-13 ENCOUNTER — Telehealth: Payer: Self-pay | Admitting: Podiatry

## 2024-02-13 NOTE — Telephone Encounter (Signed)
 Inserts was adjust for the 1st time and was told he will not be charged the $90 yet he received a bill.  He said the front desk had checked w/ you. If this is correct, how do we get the charge corrected?

## 2024-02-17 DIAGNOSIS — L218 Other seborrheic dermatitis: Secondary | ICD-10-CM | POA: Diagnosis not present

## 2024-02-17 DIAGNOSIS — R208 Other disturbances of skin sensation: Secondary | ICD-10-CM | POA: Diagnosis not present

## 2024-02-17 DIAGNOSIS — L400 Psoriasis vulgaris: Secondary | ICD-10-CM | POA: Diagnosis not present

## 2024-02-17 DIAGNOSIS — L570 Actinic keratosis: Secondary | ICD-10-CM | POA: Diagnosis not present

## 2024-02-17 DIAGNOSIS — L739 Follicular disorder, unspecified: Secondary | ICD-10-CM | POA: Diagnosis not present

## 2024-02-17 DIAGNOSIS — L538 Other specified erythematous conditions: Secondary | ICD-10-CM | POA: Diagnosis not present

## 2024-02-17 DIAGNOSIS — L918 Other hypertrophic disorders of the skin: Secondary | ICD-10-CM | POA: Diagnosis not present

## 2024-02-25 ENCOUNTER — Encounter: Payer: Self-pay | Admitting: Podiatry

## 2024-02-25 ENCOUNTER — Ambulatory Visit: Payer: BC Managed Care – PPO | Admitting: Podiatry

## 2024-02-25 VITALS — Ht 75.0 in | Wt 250.0 lb

## 2024-02-25 DIAGNOSIS — D2372 Other benign neoplasm of skin of left lower limb, including hip: Secondary | ICD-10-CM

## 2024-02-25 NOTE — Progress Notes (Signed)
  Subjective:  Patient ID: Adhav Croxford, male    DOB: 24-Feb-1981,  MRN: [id number]  Chief Complaint  Patient presents with   Callouses    Patient is here for callous trim down    43 y.o. male presents with the above complaint. History confirmed with patient.  He returns for follow-up, the lesion has returned again and is painful.    Objective:  Physical Exam: warm, good capillary refill, no trophic changes or ulcerative lesions, normal DP and PT pulses, normal sensory exam, and he has gastrocnemius equinus bilaterally, single solitary lesion submetatarsal 3 that is painful and benign appearing  Assessment:   1. Benign neoplasm of skin of left foot       Plan:  Patient was evaluated and treated and all questions answered.  Lesion was debrided today and the core was enucleated with a sharp #312 scalpel blade.  Salicylic acid was applied to destroy the lesion.  A bandage was applied and he will leave this on for 24 hours.  He will schedule follow-up in 3 to 4 months for retreatment as needed.  Continues in custom molded foot orthoses   Return if symptoms worsen or fail to improve.

## 2024-03-27 ENCOUNTER — Other Ambulatory Visit: Payer: Self-pay | Admitting: Medical

## 2024-05-21 ENCOUNTER — Telehealth: Payer: Self-pay

## 2024-05-21 ENCOUNTER — Other Ambulatory Visit (HOSPITAL_COMMUNITY): Payer: Self-pay

## 2024-05-21 NOTE — Telephone Encounter (Signed)
 Pharmacy Patient Advocate Encounter   Received notification from CoverMyMeds that prior authorization for Omega-3-acid  Ethyl Esters 1GM capsules is required/requested.   Insurance verification completed.   The patient is insured through [employer] [place] .   Per test claim: PA required; PA submitted to above mentioned insurance via CoverMyMeds Key/confirmation #/EOC (Key: [id number])   Status is pending

## 2024-05-22 ENCOUNTER — Other Ambulatory Visit (HOSPITAL_COMMUNITY): Payer: Self-pay

## 2024-05-22 NOTE — Telephone Encounter (Signed)
 Pharmacy Patient Advocate Encounter  Received notification from [employer] [place]  that Prior Authorization for Omega-3-acid  Ethyl Esters 1GM capsules has been APPROVED from 7.31.25 to 7.31.26. Ran test claim, Copay is $30.00. This test claim was processed through [facility]- copay amounts may vary at other pharmacies due to pharmacy/plan contracts, or as the patient moves through the different stages of their insurance plan.   PA #/Case ID/Reference #: (Key: [id number])

## 2024-06-02 ENCOUNTER — Ambulatory Visit (INDEPENDENT_AMBULATORY_CARE_PROVIDER_SITE_OTHER): Admitting: Podiatry

## 2024-06-02 VITALS — Ht 75.0 in | Wt 250.0 lb

## 2024-06-02 DIAGNOSIS — D2372 Other benign neoplasm of skin of left lower limb, including hip: Secondary | ICD-10-CM

## 2024-06-02 NOTE — Progress Notes (Signed)
  Subjective:  Patient ID: Wesley Cruz, male    DOB: 09-05-1981,  MRN: [id number]  Chief Complaint  Patient presents with   Callouses    RM 4 left foot corn removal.    43 y.o. male presents with the above complaint. History confirmed with patient.  He returns for follow-up, the lesion has returned again and is painful.    Objective:  Physical Exam: warm, good capillary refill, no trophic changes or ulcerative lesions, normal DP and PT pulses, normal sensory exam, and he has gastrocnemius equinus bilaterally, single solitary lesion submetatarsal 3 that is painful and benign appearing  Assessment:   1. Benign neoplasm of skin of left foot       Plan:  Patient was evaluated and treated and all questions answered.  Lesion was debrided today and the core was enucleated with a sharp #312 scalpel blade.  Salicylic acid was applied to destroy the lesion.  A bandage was applied and he will leave this on for 24 hours.  He will schedule follow-up in 3 to 4 months for retreatment or as needed.  Continues in custom molded foot orthoses.  We also discussed possible laser treatment, his previous pathology report included involuting deeper verruca in the differential and at this point may be reasonable to try laser therapy to see if this alleviates any further   Return in about 16 weeks (around 09/22/2024) for corn/wart treatment (possible laser).

## 2024-06-20 ENCOUNTER — Other Ambulatory Visit: Payer: Self-pay | Admitting: Medical

## 2024-06-23 ENCOUNTER — Encounter: Payer: Self-pay | Admitting: Medical

## 2024-06-23 ENCOUNTER — Ambulatory Visit: Payer: BC Managed Care – PPO | Admitting: Medical

## 2024-06-23 VITALS — BP 112/68 | HR 82 | Ht 75.0 in | Wt 258.0 lb

## 2024-06-23 DIAGNOSIS — Z23 Encounter for immunization: Secondary | ICD-10-CM | POA: Diagnosis not present

## 2024-06-23 DIAGNOSIS — F17200 Nicotine dependence, unspecified, uncomplicated: Secondary | ICD-10-CM

## 2024-06-23 DIAGNOSIS — F1021 Alcohol dependence, in remission: Secondary | ICD-10-CM

## 2024-06-23 DIAGNOSIS — L409 Psoriasis, unspecified: Secondary | ICD-10-CM

## 2024-06-23 DIAGNOSIS — Z0000 Encounter for general adult medical examination without abnormal findings: Secondary | ICD-10-CM | POA: Diagnosis not present

## 2024-06-23 DIAGNOSIS — E785 Hyperlipidemia, unspecified: Secondary | ICD-10-CM

## 2024-06-23 DIAGNOSIS — Z122 Encounter for screening for malignant neoplasm of respiratory organs: Secondary | ICD-10-CM | POA: Diagnosis not present

## 2024-06-23 DIAGNOSIS — K760 Fatty (change of) liver, not elsewhere classified: Secondary | ICD-10-CM

## 2024-06-23 DIAGNOSIS — I10 Essential (primary) hypertension: Secondary | ICD-10-CM | POA: Diagnosis not present

## 2024-06-23 DIAGNOSIS — E559 Vitamin D deficiency, unspecified: Secondary | ICD-10-CM | POA: Diagnosis not present

## 2024-06-23 LAB — COMPREHENSIVE METABOLIC PANEL WITH GFR
ALT: 32 IU/L (ref 0–44)
AST: 24 IU/L (ref 0–40)
Albumin: 4.8 g/dL (ref 4.1–5.1)
Alkaline Phosphatase: 67 IU/L (ref 44–121)
BUN/Creatinine Ratio: 13 (ref 9–20)
BUN: 12 mg/dL (ref 6–24)
Bilirubin Total: 0.8 mg/dL (ref 0.0–1.2)
CO2: 20 mmol/L (ref 20–29)
Calcium: 9.6 mg/dL (ref 8.7–10.2)
Chloride: 101 mmol/L (ref 96–106)
Creatinine, Ser: 0.9 mg/dL (ref 0.76–1.27)
Globulin, Total: 2 g/dL (ref 1.5–4.5)
Glucose: 103 mg/dL — ABNORMAL HIGH (ref 70–99)
Potassium: 4 mmol/L (ref 3.5–5.2)
Sodium: 137 mmol/L (ref 134–144)
Total Protein: 6.8 g/dL (ref 6.0–8.5)
eGFR: 109 mL/min/1.73 (ref >59)

## 2024-06-23 LAB — LIPID PANEL
Chol/HDL Ratio: 2.4 ratio (ref 0.0–5.0)
Cholesterol, Total: 110 mg/dL (ref 100–199)
HDL: 45 mg/dL (ref >39)
LDL Chol Calc (NIH): 52 mg/dL (ref 0–99)
Triglycerides: 55 mg/dL (ref 0–149)
VLDL Cholesterol Cal: 13 mg/dL (ref 5–40)

## 2024-06-23 LAB — CBC
Hematocrit: 48.6 % (ref 37.5–51.0)
Hemoglobin: 16.2 g/dL (ref 13.0–17.7)
MCH: 31.3 pg (ref 26.6–33.0)
MCHC: 33.3 g/dL (ref 31.5–35.7)
MCV: 94 fL (ref 79–97)
Platelets: 223 x10E3/uL (ref 150–450)
RBC: 5.17 x10E6/uL (ref 4.14–5.80)
RDW: 12.3 % (ref 11.6–15.4)
WBC: 7.8 x10E3/uL (ref 3.4–10.8)

## 2024-06-23 NOTE — Progress Notes (Signed)
 Subjective:   HPI  Wesley Cruz is a 43 y.o. male who presents for Chief Complaint  Patient presents with   Annual Exam    Fasting cpe, no concerns    Patient Care Team: Carey Johndrow, Alm RAMAN, PA-C as PCP - General (Family Medicine) Silva Juliene SAUNDERS, DPM as Consulting Physician (Podiatry) Sees dentist Sees eye doctor Dr. Juliene Silva, podiatry Dr. Bernarda Ned, general surgery Dr.Henry Danis, and prior colonoscopy with Dr. Renaye Kristie [facility], Dr Elnor Dr. Renaye Kristie, GI   Concerns: Here for physical.  Doing well.  Sober 7 years.   has smoked regularly for 21 years  Compliant with BP and cholesterol medications, fish oil and statin.  Sees dermatology  Had colonoscopy 12/2023.  Active, exercising 5 days per week.    Still smokes.  Not ready to give this up.  Reviewed their medical, surgical, family, social, medication, and allergy history and updated chart as appropriate.  Past Medical History:  Diagnosis Date   Acne    Anal skin tag    Elevated LFTs    2017 - 2021 ; abnormal US , negative hepatitis infection 2017 labs   History of alcohol abuse    16 years of drinking; went through residential treatment program 2018; sober 5 years as of 05/2022   Hyperlipidemia 03/2014   Hypertension 03/2014   Seizure (HCC) 2009   questionable seizure, syncope, one episode, none since - seen at [facility] ED     Past Surgical History:  Procedure Laterality Date   COLONOSCOPY  2017   Dr. Kristie   EXCISION OF SKIN TAG N/A 09/01/2020   Procedure: EXCISION OF SKIN TAG;  Surgeon: Ned Bernarda, MD;  Location: [facility];  Service: General;  Laterality: N/A;   FISTULOTOMY N/A 09/01/2020   Procedure: FISTULOTOMY;  Surgeon: Ned Bernarda, MD;  Location: [facility];  Service: General;  Laterality: N/A;   LACERATION REPAIR  2009   right dorsal hand; s/p punching wall   RECTAL EXAM UNDER ANESTHESIA N/A 09/01/2020   Procedure: anal EXAM UNDER  ANESTHESIA;  Surgeon: Ned Bernarda, MD;  Location: [facility];  Service: General;  Laterality: N/A;    Family History  Problem Relation Age of Onset   Hypertension Father    Hypertension Paternal Uncle    Stroke Paternal Uncle 58   Stroke Paternal Uncle 40   Heart disease Paternal Uncle        CABG   Heart disease Paternal Uncle        CAD, stents   Cancer Paternal Grandmother        lung?   COPD Paternal Grandmother    COPD Paternal Grandfather    Diabetes Neg Hx      Current Outpatient Medications:    Cholecalciferol (VITAMIN D ) 50 MCG (2000 UT) CAPS, Take 1 capsule (2,000 Units total) by mouth daily., Disp: 90 capsule, Rfl: 2   EPINEPHrine  0.3 mg/0.3 mL IJ SOAJ injection, INJECT INTO THE MIDDLE OF THE OUTER THIGH AND HOLD FOR 3 SECONDS AS NEEDED FOR SEVERE ALLERGIC REACTION THEN CALL 911 IF USED., Disp: 2 each, Rfl: 0   hydrochlorothiazide  (HYDRODIURIL ) 25 MG tablet, Take 1 tablet (25 mg total) by mouth daily., Disp: 90 tablet, Rfl: 2   omega-3 acid ethyl esters (LOVAZA ) 1 g capsule, Take 2 capsules (2 g total) by mouth 2 (two) times daily., Disp: 360 capsule, Rfl: 2   pimecrolimus (ELIDEL) 1 % cream, Apply topically., Disp: , Rfl:  rosuvastatin  (CRESTOR ) 40 MG tablet, TAKE 1 TABLET BY MOUTH DAILY, Disp: 90 tablet, Rfl: 2   triamcinolone  cream (KENALOG ) 0.1 %, Apply topically., Disp: , Rfl:   Allergies  Allergen Reactions   Hornet Venom Anaphylaxis    Japanese hornet   Lisinopril      cough    Review of Systems  Constitutional:  Negative for chills, fever, malaise/fatigue and weight loss.  HENT:  Negative for congestion, ear pain, hearing loss, sore throat and tinnitus.   Eyes:  Negative for blurred vision, pain and redness.  Respiratory:  Negative for cough, hemoptysis and shortness of breath.   Cardiovascular:  Negative for chest pain, palpitations, orthopnea, claudication and leg swelling.  Gastrointestinal:  Negative for abdominal pain, blood in  stool, constipation, diarrhea, nausea and vomiting.  Genitourinary:  Negative for dysuria, flank pain, frequency, hematuria and urgency.  Musculoskeletal:  Negative for falls, joint pain and myalgias.  Skin:  Negative for itching and rash.  Neurological:  Negative for dizziness, tingling, speech change, weakness and headaches.  Endo/Heme/Allergies:  Negative for polydipsia. Does not bruise/bleed easily.  Psychiatric/Behavioral:  Negative for depression and memory loss. The patient is not nervous/anxious and does not have insomnia.        06/23/2024    8:10 AM 06/18/2023    8:16 AM 06/12/2022    9:14 AM 12/01/2021    9:35 AM 05/30/2021    9:07 AM  Depression screen PHQ 2/9  Decreased Interest 0 0 0 0 0  Down, Depressed, Hopeless 0 0 0 0 0  PHQ - 2 Score 0 0 0 0 0        Objective:  BP 112/68   Pulse 82   Ht 6' 3 (1.905 m)   Wt 258 lb (117 kg)   BMI 32.25 kg/m   Wt Readings from Last 3 Encounters:  06/23/24 258 lb (117 kg)  06/02/24 250 lb (113.4 kg)  02/25/24 250 lb (113.4 kg)   BP Readings from Last 3 Encounters:  06/23/24 112/68  06/18/23 106/66  06/14/23 120/70    General appearance: alert, no distress, WD/WN, Caucasian male Skin:  skin unremarkable HEENT: normocephalic, conjunctiva/corneas normal, sclerae anicteric, PERRLA, EOMi, nares patent, no discharge or erythema, pharynx normal Neck: supple, no lymphadenopathy, no thyromegaly, no masses, normal ROM, no bruits Chest: non tender, normal shape and expansion Heart: RRR, normal S1, S2, no murmurs Lungs: CTA bilaterally, no wheezes, rhonchi, or rales Abdomen: +bs, soft, non tender, non distended, no masses, no hepatomegaly, no splenomegaly, no bruits Back: somewhat stiff and reduced ROM due to low back pain, otherwise non tender, no scoliosis Musculoskeletal:  upper extremities non tender, no obvious deformity, normal ROM throughout, lower extremities non tender, no obvious deformity, normal ROM  throughout Extremities: no edema, no cyanosis, no clubbing Pulses: 2+ symmetric, upper and lower extremities, normal cap refill Neurological: alert, oriented x 3, CN2-12 intact, strength normal upper extremities and lower extremities, sensation normal throughout, DTRs 2+ throughout, no cerebellar signs, gait normal Psychiatric: normal affect, behavior normal, pleasant  GU: normal male external genitalia,circumcised, nontender, no masses, no hernia, no lymphadenopathy Rectal: deferred    Assessment and Plan :   Encounter Diagnoses  Name Primary?   Encounter for health maintenance examination in adult Yes   Vitamin D  deficiency    Recovering alcoholic in remission (HCC)    Psoriasis    Fatty liver    Hyperlipidemia, unspecified hyperlipidemia type    Smoker    Screening for lung cancer  Need for influenza vaccination    Essential hypertension    Needs flu shot      This visit was a preventative care visit, also known as wellness visit or routine physical.   Topics typically include healthy lifestyle, diet, exercise, preventative care, vaccinations, sick and well care, proper use of emergency dept and after hours care, as well as other concerns.     Recommendations: Continue to return yearly for your annual wellness and preventative care visits.  This gives us  a chance to discuss healthy lifestyle, exercise, vaccinations, review your chart record, and perform screenings where appropriate.  I recommend you see your eye doctor yearly for routine vision care.  I recommend you see your dentist yearly for routine dental care including hygiene visits twice yearly.   Vaccination recommendations were reviewed Immunization History  Administered Date(s) Administered   Influenza, Seasonal, Injecte, Preservative Fre 06/18/2023, 06/23/2024   Influenza,inj,Quad PF,6+ Mos 08/07/2019, 06/12/2022   Influenza-Unspecified 08/22/2018, 06/22/2021   PFIZER(Purple Top)SARS-COV-2 Vaccination  12/26/2019, 01/26/2020   Pneumococcal Polysaccharide-23 03/24/2019   Tdap 12/20/2014   Counseled on the influenza virus vaccine.  Vaccine information sheet given.  Influenza vaccine given after consent obtained.  Counseled on pneumococcal vaccine ,other vaccines . Declines others for now   Screening for cancer: Colon cancer screening: Age 17  We discussed PSA, prostate exam, and prostate cancer screening risks/benefits.   Age 27  Skin cancer screening: Check your skin regularly for new changes, growing lesions, or other lesions of concern Come in for evaluation if you have skin lesions of concern.  Lung cancer screening: If you have a greater than 20 pack year history of tobacco use, then you may qualify for lung cancer screening with a chest CT scan.   Please call your insurance company to inquire about coverage for this test.  We currently don't have screenings for other cancers besides breast, cervical, colon, and lung cancers.  If you have a strong family history of cancer or have other cancer screening concerns, please let me know.    Bone health: Get at least 150 minutes of aerobic exercise weekly Get weight bearing exercise at least once weekly Bone density test:  A bone density test is an imaging test that uses a type of X-ray to measure the amount of calcium  and other minerals in your bones. The test may be used to diagnose or screen you for a condition that causes weak or thin bones (osteoporosis), predict your risk for a broken bone (fracture), or determine how well your osteoporosis treatment is working. The bone density test is recommended for females 65 and older, or females or males <65 if certain risk factors such as thyroid  disease, long term use of steroids such as for asthma or rheumatological issues, vitamin D  deficiency, estrogen deficiency, family history of osteoporosis, self or family history of fragility fracture in first degree relative.    Heart  health: Get at least 150 minutes of aerobic exercise weekly Limit alcohol It is important to maintain a healthy blood pressure and healthy cholesterol numbers  Heart disease screening: Screening for heart disease includes screening for blood pressure, fasting lipids, glucose/diabetes screening, BMI height to weight ratio, reviewed of smoking status, physical activity, and diet.    Goals include blood pressure 120/80 or less, maintaining a healthy lipid/cholesterol profile, preventing diabetes or keeping diabetes numbers under good control, not smoking or using tobacco products, exercising most days per week or at least 150 minutes per week of exercise, and eating  healthy variety of fruits and vegetables, healthy oils, and avoiding unhealthy food choices like fried food, fast food, high sugar and high cholesterol foods.    Other tests may possibly include EKG test, CT coronary calcium  score, echocardiogram, exercise treadmill stress test.     Medical care options: I recommend you continue to seek care here first for routine care.  We try really hard to have available appointments Monday through Friday daytime hours for sick visits, acute visits, and physicals.  Urgent care should be used for after hours and weekends for significant issues that cannot wait till the next day.  The emergency department should be used for significant potentially life-threatening emergencies.  The emergency department is expensive, can often have long wait times for less significant concerns, so try to utilize primary care, urgent care, or telemedicine when possible to avoid unnecessary trips to the emergency department.  Virtual visits and telemedicine have been introduced since the pandemic started in 2020, and can be convenient ways to receive medical care.  We offer virtual appointments as well to assist you in a variety of options to seek medical care.   Separate significant issues discussed:  HTN -continue  current medication HCTZ 25mg  daily, routine labs today.  Work on efforts to lose weight and to quit smoking.  He has desire to come off medication which could be possible with weight loss.  Hyperlipidemia and fatty liver disease-continue current medications Crestor  40mg  daily and Lovaza .  He may want to come off the medication going forward.  We discussed a trial off the medicine if he desires.  Vitamin D  deficiency-he is currently using vitamin D  2000 units daily.  Labs today  Tobacco use - advised cessation. He is not ready to quit   Psoriasis - managed by dermatology  Hx/o fatty liver - counseled on diet, exercise, weight loss   Axzel was seen today for annual exam.  Diagnoses and all orders for this visit:  Encounter for health maintenance examination in adult -     CBC -     Comprehensive metabolic panel with GFR -     Lipid panel -     CT CHEST LUNG CA SCREEN LOW DOSE W/O CM; Future -     Urinalysis, Routine w reflex microscopic  Vitamin D  deficiency  Recovering alcoholic in remission (HCC)  Psoriasis  Fatty liver  Hyperlipidemia, unspecified hyperlipidemia type -     Lipid panel  Smoker -     CT CHEST LUNG CA SCREEN LOW DOSE W/O CM; Future -     Urinalysis, Routine w reflex microscopic  Screening for lung cancer -     CT CHEST LUNG CA SCREEN LOW DOSE W/O CM; Future  Need for influenza vaccination  Essential hypertension  Needs flu shot -     Flu vaccine trivalent PF, 6mos and older(Flulaval,Afluria,Fluarix,Fluzone)     Follow-up pending labs, yearly for physical

## 2024-06-24 ENCOUNTER — Other Ambulatory Visit: Payer: Self-pay | Admitting: Medical

## 2024-06-24 ENCOUNTER — Ambulatory Visit: Payer: Self-pay | Admitting: Medical

## 2024-06-24 LAB — URINALYSIS, ROUTINE W REFLEX MICROSCOPIC
Bilirubin, UA: NEGATIVE
Glucose, UA: NEGATIVE
Ketones, UA: NEGATIVE
Leukocytes,UA: NEGATIVE
Nitrite, UA: NEGATIVE
Protein,UA: NEGATIVE
RBC, UA: NEGATIVE
Specific Gravity, UA: 1.008 (ref 1.005–1.030)
Urobilinogen, Ur: 0.2 mg/dL (ref 0.2–1.0)
pH, UA: 6.5 (ref 5.0–7.5)

## 2024-06-24 MED ORDER — OMEGA-3-ACID ETHYL ESTERS 1 G PO CAPS: 2.0000 | ORAL_CAPSULE | Freq: Two times a day (BID) | ORAL | 2 refills | Status: AC

## 2024-06-24 MED ORDER — VITAMIN D 50 MCG (2000 UT) PO CAPS: 1.0000 | ORAL_CAPSULE | Freq: Every day | ORAL | 2 refills | Status: AC

## 2024-06-24 MED ORDER — EPINEPHRINE 0.3 MG/0.3ML IJ SOAJ: 0.3000 mg | INTRAMUSCULAR | 0 refills | Status: AC | PRN

## 2024-06-24 NOTE — Progress Notes (Signed)
 Results thru my chart

## 2024-06-29 ENCOUNTER — Other Ambulatory Visit: Payer: Self-pay | Admitting: Medical

## 2024-06-29 DIAGNOSIS — Z136 Encounter for screening for cardiovascular disorders: Secondary | ICD-10-CM

## 2024-06-29 NOTE — Telephone Encounter (Signed)
 Let him know that I put in an order for CT coronary heart disease screening test.  Since he is not 43 years old, he does not qualify for the CT lung cancer screening which is more thorough for the lungs  However we can do the CT coronary artery screen test which is $100 cash supposedly, and is still a good test and sees some of the lungs, but would not see the entire lungs.

## 2024-07-23 ENCOUNTER — Ambulatory Visit: Payer: Self-pay | Admitting: Medical

## 2024-07-23 ENCOUNTER — Ambulatory Visit (HOSPITAL_COMMUNITY)
Admission: RE | Admit: 2024-07-23 | Discharge: 2024-07-23 | Disposition: A | Discharge status: Home / Self Care | Payer: Self-pay | Source: Ambulatory Visit | Attending: Medical | Admitting: Medical

## 2024-07-23 DIAGNOSIS — Z136 Encounter for screening for cardiovascular disorders: Secondary | ICD-10-CM | POA: Insufficient documentation

## 2024-07-23 NOTE — Progress Notes (Signed)
 Results thru my chart

## 2024-08-06 DIAGNOSIS — L738 Other specified follicular disorders: Secondary | ICD-10-CM | POA: Diagnosis not present

## 2024-08-06 DIAGNOSIS — L700 Acne vulgaris: Secondary | ICD-10-CM | POA: Diagnosis not present

## 2024-08-06 DIAGNOSIS — D492 Neoplasm of unspecified behavior of bone, soft tissue, and skin: Secondary | ICD-10-CM | POA: Diagnosis not present

## 2024-09-14 DIAGNOSIS — M25562 Pain in left knee: Secondary | ICD-10-CM | POA: Diagnosis not present

## 2024-09-20 DIAGNOSIS — J020 Streptococcal pharyngitis: Secondary | ICD-10-CM | POA: Diagnosis not present

## 2024-09-20 DIAGNOSIS — J029 Acute pharyngitis, unspecified: Secondary | ICD-10-CM | POA: Diagnosis not present

## 2024-09-22 ENCOUNTER — Ambulatory Visit: Admitting: Podiatry

## 2024-09-22 DIAGNOSIS — D2372 Other benign neoplasm of skin of left lower limb, including hip: Secondary | ICD-10-CM | POA: Diagnosis not present

## 2024-09-22 NOTE — Progress Notes (Signed)
  Subjective:  Patient ID: Wesley Cruz, male    DOB: 07-21-1981,  MRN: [id number]  Chief Complaint  Patient presents with   Benign neoplasm of skin of left foot    Pt is here for a follow up on Benign neoplasm of skin of left foot he stated that it is doing a lot better than it was the last time he came in, he stated that the last treatment really helped.     43 y.o. male presents with the above complaint. History confirmed with patient.  He returns for follow-up, the lesion has returned again and is painful.    Objective:  Physical Exam: warm, good capillary refill, no trophic changes or ulcerative lesions, normal DP and PT pulses, normal sensory exam, and he has gastrocnemius equinus bilaterally, single solitary lesion submetatarsal 3 that is painful and benign appearing  Assessment:   1. Benign neoplasm of skin of left foot       Plan:  Patient was evaluated and treated and all questions answered.  Lesion was debrided today and the core was enucleated with a sharp #312 scalpel blade and expose the deepest portions of the lesion.  Salicylic acid was applied to destroy the lesion.  A bandage was applied and he will leave this on for 24 hours.  He will schedule follow-up in 3 to 4 months for retreatment or as needed.  Continues in custom molded foot orthoses.  Significant improvement today we will continue with salicylic acid treatment intermittently   Return in about 3 months (around 12/21/2024) for left foot skin lesion treatment.

## 2024-11-16 ENCOUNTER — Encounter: Payer: Self-pay | Admitting: *Deleted

## 2024-12-03 ENCOUNTER — Ambulatory Visit: Admitting: Podiatry

## 2024-12-29 ENCOUNTER — Ambulatory Visit: Admitting: Podiatry

## 2025-06-29 ENCOUNTER — Encounter: Payer: Self-pay | Admitting: Medical
